# Patient Record
Sex: Male | Born: 1965 | Race: White | Hispanic: No | Marital: Married | Smoking: Current some day smoker
Health system: Southern US, Community
[De-identification: ages and names within clinical notes are randomized; demographics above are authoritative.]

## PROBLEM LIST (undated history)

## (undated) DIAGNOSIS — F329 Major depressive disorder, single episode, unspecified: Secondary | ICD-10-CM

## (undated) DIAGNOSIS — T8859XA Other complications of anesthesia, initial encounter: Secondary | ICD-10-CM

## (undated) DIAGNOSIS — K219 Gastro-esophageal reflux disease without esophagitis: Secondary | ICD-10-CM

## (undated) DIAGNOSIS — F32A Depression, unspecified: Secondary | ICD-10-CM

## (undated) DIAGNOSIS — J45909 Unspecified asthma, uncomplicated: Secondary | ICD-10-CM

## (undated) DIAGNOSIS — Z973 Presence of spectacles and contact lenses: Secondary | ICD-10-CM

## (undated) DIAGNOSIS — T4145XA Adverse effect of unspecified anesthetic, initial encounter: Secondary | ICD-10-CM

## (undated) DIAGNOSIS — I10 Essential (primary) hypertension: Secondary | ICD-10-CM

## (undated) DIAGNOSIS — M1990 Unspecified osteoarthritis, unspecified site: Secondary | ICD-10-CM

## (undated) DIAGNOSIS — J189 Pneumonia, unspecified organism: Secondary | ICD-10-CM

## (undated) DIAGNOSIS — G4730 Sleep apnea, unspecified: Secondary | ICD-10-CM

## (undated) DIAGNOSIS — R51 Headache: Secondary | ICD-10-CM

## (undated) HISTORY — PX: MUSCLE BIOPSY: SHX716

---

## 2009-02-11 HISTORY — PX: JOINT REPLACEMENT: SHX530

## 2009-02-20 ENCOUNTER — Inpatient Hospital Stay (HOSPITAL_COMMUNITY): Admission: RE | Admit: 2009-02-20 | Discharge: 2009-02-23 | Payer: Self-pay | Admitting: Orthopedic Surgery

## 2010-04-29 LAB — PROTIME-INR
INR: 3.43 — ABNORMAL HIGH (ref 0.00–1.49)
Prothrombin Time: 18.6 seconds — ABNORMAL HIGH (ref 11.6–15.2)
Prothrombin Time: 34.3 seconds — ABNORMAL HIGH (ref 11.6–15.2)

## 2010-04-29 LAB — BASIC METABOLIC PANEL
BUN: 11 mg/dL (ref 6–23)
CO2: 27 mEq/L (ref 19–32)
Calcium: 9.7 mg/dL (ref 8.4–10.5)
GFR calc Af Amer: >60 mL/min (ref >60)
GFR calc non Af Amer: >60 mL/min (ref >60)
GFR calc non Af Amer: >60 mL/min (ref >60)
Glucose, Bld: 167 mg/dL — ABNORMAL HIGH (ref 70–99)
Potassium: 3.8 mEq/L (ref 3.5–5.1)
Potassium: 4.4 mEq/L (ref 3.5–5.1)
Sodium: 136 mEq/L (ref 135–145)

## 2010-04-29 LAB — DIFFERENTIAL
Lymphocytes Relative: 31 % (ref 12–46)
Monocytes Absolute: 0.6 10*3/uL (ref 0.1–1.0)
Monocytes Relative: 6 % (ref 3–12)
Neutro Abs: 5.6 10*3/uL (ref 1.7–7.7)

## 2010-04-29 LAB — CBC
HCT: 30.8 % — ABNORMAL LOW (ref 39.0–52.0)
HCT: 42 % (ref 39.0–52.0)
Hemoglobin: 14.7 g/dL (ref 13.0–17.0)
MCHC: 34.4 g/dL (ref 30.0–36.0)
MCHC: 34.7 g/dL (ref 30.0–36.0)
MCV: 89.5 fL (ref 78.0–100.0)
Platelets: 213 10*3/uL (ref 150–400)
Platelets: 218 10*3/uL (ref 150–400)
RBC: 3.02 MIL/uL — ABNORMAL LOW (ref 4.22–5.81)
RBC: 4.73 MIL/uL (ref 4.22–5.81)
RDW: 13.5 % (ref 11.5–15.5)
RDW: 13.9 % (ref 11.5–15.5)
WBC: 18.5 10*3/uL — ABNORMAL HIGH (ref 4.0–10.5)

## 2010-04-29 LAB — TYPE AND SCREEN
ABO/RH(D): A POS
Antibody Screen: NEGATIVE

## 2010-04-29 LAB — URINALYSIS, ROUTINE W REFLEX MICROSCOPIC
Ketones, ur: NEGATIVE mg/dL
Nitrite: NEGATIVE
Specific Gravity, Urine: 1.019 (ref 1.005–1.030)
pH: 6.5 (ref 5.0–8.0)

## 2010-04-29 LAB — APTT: aPTT: 31 seconds (ref 24–37)

## 2010-12-25 ENCOUNTER — Other Ambulatory Visit (HOSPITAL_COMMUNITY): Payer: Self-pay | Admitting: Orthopedic Surgery

## 2010-12-25 DIAGNOSIS — M25551 Pain in right hip: Secondary | ICD-10-CM

## 2010-12-28 ENCOUNTER — Ambulatory Visit (HOSPITAL_COMMUNITY)
Admission: RE | Admit: 2010-12-28 | Discharge: 2010-12-28 | Disposition: A | Discharge status: Home / Self Care | Payer: BC Managed Care – PPO | Source: Ambulatory Visit | Attending: Orthopedic Surgery | Admitting: Orthopedic Surgery

## 2010-12-28 DIAGNOSIS — M25551 Pain in right hip: Secondary | ICD-10-CM

## 2010-12-28 DIAGNOSIS — M25559 Pain in unspecified hip: Secondary | ICD-10-CM | POA: Insufficient documentation

## 2011-10-25 ENCOUNTER — Other Ambulatory Visit (HOSPITAL_COMMUNITY): Payer: Self-pay | Admitting: Orthopedic Surgery

## 2011-10-25 DIAGNOSIS — M25559 Pain in unspecified hip: Secondary | ICD-10-CM

## 2011-10-25 DIAGNOSIS — M1610 Unilateral primary osteoarthritis, unspecified hip: Secondary | ICD-10-CM

## 2011-10-29 ENCOUNTER — Ambulatory Visit (HOSPITAL_COMMUNITY)
Admission: RE | Admit: 2011-10-29 | Discharge: 2011-10-29 | Disposition: A | Discharge status: Home / Self Care | Payer: BC Managed Care – PPO | Source: Ambulatory Visit | Attending: Orthopedic Surgery | Admitting: Orthopedic Surgery

## 2011-10-29 DIAGNOSIS — M25559 Pain in unspecified hip: Secondary | ICD-10-CM | POA: Insufficient documentation

## 2011-10-29 DIAGNOSIS — M1610 Unilateral primary osteoarthritis, unspecified hip: Secondary | ICD-10-CM

## 2011-10-29 DIAGNOSIS — Z96649 Presence of unspecified artificial hip joint: Secondary | ICD-10-CM | POA: Insufficient documentation

## 2011-12-20 ENCOUNTER — Other Ambulatory Visit: Payer: Self-pay | Admitting: Orthopedic Surgery

## 2011-12-20 ENCOUNTER — Encounter (HOSPITAL_COMMUNITY): Payer: Self-pay | Admitting: Pharmacy Technician

## 2011-12-26 NOTE — Pre-Procedure Instructions (Addendum)
20 Eduardo Tucker  12/26/2011   Your procedure is scheduled on:  Wednesday November 20  Report to [facility] at 10:00 AM.  Call this number if you have problems the morning of surgery: [phone]   Remember:   Do not eat or drink:After Midnight.    Take these medicines the morning of surgery with A SIP OF WATER: Carbamazepine, Depakote, Lithium, Lyrica pain med  STOP aspirin, multi vit now   Do not wear jewelry, make-up or nail polish.  Do not wear lotions, powders, or perfumes. You may wear deodorant.  Do not shave 48 hours prior to surgery. Men may shave face and neck.  Do not bring valuables to the hospital.  Contacts, dentures or bridgework may not be worn into surgery.  Leave suitcase in the car. After surgery it may be brought to your room.  For patients admitted to the hospital, checkout time is 11:00 AM the day of discharge.   Patients discharged the day of surgery will not be allowed to drive home.  Judeth Cornfield Lauter wife [phone]  Special Instructions: Shower using CHG 2 nights before surgery and the night before surgery.  If you shower the day of surgery use CHG.  Use special wash - you have one bottle of CHG for all showers.  You should use approximately 1/3 of the bottle for each shower.   Please read over the following fact sheets that you were given: Pain Booklet, Coughing and Deep Breathing, Blood Transfusion Information and Surgical Site Infection Prevention

## 2011-12-27 ENCOUNTER — Encounter (HOSPITAL_COMMUNITY): Payer: Self-pay

## 2011-12-27 ENCOUNTER — Encounter (HOSPITAL_COMMUNITY): Admission: RE | Admit: 2011-12-27 | Payer: BC Managed Care – PPO | Source: Ambulatory Visit

## 2011-12-27 ENCOUNTER — Encounter (HOSPITAL_COMMUNITY)
Admission: RE | Admit: 2011-12-27 | Discharge: 2011-12-27 | Disposition: A | Discharge status: Home / Self Care | Payer: BC Managed Care – PPO | Source: Ambulatory Visit | Attending: Orthopedic Surgery | Admitting: Orthopedic Surgery

## 2011-12-27 HISTORY — DX: Gastro-esophageal reflux disease without esophagitis: K21.9

## 2011-12-27 HISTORY — DX: Unspecified osteoarthritis, unspecified site: M19.90

## 2011-12-27 HISTORY — DX: Depression, unspecified: F32.A

## 2011-12-27 HISTORY — DX: Pneumonia, unspecified organism: J18.9

## 2011-12-27 HISTORY — DX: Major depressive disorder, single episode, unspecified: F32.9

## 2011-12-27 HISTORY — DX: Sleep apnea, unspecified: G47.30

## 2011-12-27 HISTORY — DX: Headache: R51

## 2011-12-27 HISTORY — DX: Essential (primary) hypertension: I10

## 2011-12-27 HISTORY — DX: Unspecified asthma, uncomplicated: J45.909

## 2011-12-27 LAB — COMPREHENSIVE METABOLIC PANEL
ALT: 29 U/L (ref 0–53)
AST: 16 U/L (ref 0–37)
Albumin: 3.8 g/dL (ref 3.5–5.2)
Alkaline Phosphatase: 67 U/L (ref 39–117)
Calcium: 9.8 mg/dL (ref 8.4–10.5)
GFR calc Af Amer: >90 mL/min (ref >90)
Potassium: 3.7 mEq/L (ref 3.5–5.1)
Sodium: 140 mEq/L (ref 135–145)
Total Protein: 7.3 g/dL (ref 6.0–8.3)

## 2011-12-27 LAB — TYPE AND SCREEN
ABO/RH(D): A POS
Antibody Screen: NEGATIVE

## 2011-12-27 LAB — URINALYSIS, ROUTINE W REFLEX MICROSCOPIC
Bilirubin Urine: NEGATIVE
Hgb urine dipstick: NEGATIVE
Nitrite: NEGATIVE
Protein, ur: NEGATIVE mg/dL
Specific Gravity, Urine: 1.025 (ref 1.005–1.030)
Urobilinogen, UA: 0.2 mg/dL (ref 0.0–1.0)

## 2011-12-27 LAB — CBC WITH DIFFERENTIAL/PLATELET
Basophils Absolute: 0.1 10*3/uL (ref 0.0–0.1)
Basophils Relative: 1 % (ref 0–1)
Eosinophils Absolute: 0.2 10*3/uL (ref 0.0–0.7)
Eosinophils Relative: 2 % (ref 0–5)
Lymphs Abs: 2.2 10*3/uL (ref 0.7–4.0)
MCH: 30.8 pg (ref 26.0–34.0)
MCHC: 35.6 g/dL (ref 30.0–36.0)
MCV: 86.7 fL (ref 78.0–100.0)
Neutrophils Relative %: 62 % (ref 43–77)
Platelets: 268 10*3/uL (ref 150–400)
RBC: 4.8 MIL/uL (ref 4.22–5.81)
RDW: 13.2 % (ref 11.5–15.5)

## 2011-12-27 NOTE — Progress Notes (Signed)
Sleep study req from [facility]

## 2011-12-27 NOTE — Progress Notes (Signed)
cxr not done at preadmit needs dos

## 2012-01-01 ENCOUNTER — Ambulatory Visit (HOSPITAL_COMMUNITY): Payer: BC Managed Care – PPO

## 2012-01-01 ENCOUNTER — Ambulatory Visit (HOSPITAL_COMMUNITY): Payer: BC Managed Care – PPO | Admitting: Anesthesiology

## 2012-01-01 ENCOUNTER — Encounter (HOSPITAL_COMMUNITY): Payer: Self-pay | Admitting: Anesthesiology

## 2012-01-01 ENCOUNTER — Encounter (HOSPITAL_COMMUNITY)
Admission: RE | Disposition: A | Discharge status: Home / Self Care | Payer: Self-pay | Source: Ambulatory Visit | Attending: Orthopedic Surgery

## 2012-01-01 ENCOUNTER — Encounter (HOSPITAL_COMMUNITY): Payer: Self-pay | Admitting: *Deleted

## 2012-01-01 ENCOUNTER — Ambulatory Visit (HOSPITAL_COMMUNITY)
Admission: RE | Admit: 2012-01-01 | Discharge: 2012-01-01 | Disposition: A | Discharge status: Home / Self Care | Payer: BC Managed Care – PPO | Source: Ambulatory Visit | Attending: Orthopedic Surgery | Admitting: Orthopedic Surgery

## 2012-01-01 DIAGNOSIS — J45909 Unspecified asthma, uncomplicated: Secondary | ICD-10-CM | POA: Insufficient documentation

## 2012-01-01 DIAGNOSIS — Z01812 Encounter for preprocedural laboratory examination: Secondary | ICD-10-CM | POA: Insufficient documentation

## 2012-01-01 DIAGNOSIS — M5126 Other intervertebral disc displacement, lumbar region: Secondary | ICD-10-CM | POA: Insufficient documentation

## 2012-01-01 DIAGNOSIS — Z01810 Encounter for preprocedural cardiovascular examination: Secondary | ICD-10-CM | POA: Insufficient documentation

## 2012-01-01 DIAGNOSIS — I10 Essential (primary) hypertension: Secondary | ICD-10-CM | POA: Insufficient documentation

## 2012-01-01 HISTORY — PX: LUMBAR LAMINECTOMY/DECOMPRESSION MICRODISCECTOMY: SHX5026

## 2012-01-01 SURGERY — LUMBAR LAMINECTOMY/DECOMPRESSION MICRODISCECTOMY
Anesthesia: General | Site: Spine Lumbar | Laterality: Right | Wound class: Clean

## 2012-01-01 MED ORDER — CEFAZOLIN SODIUM 1-5 GM-% IV SOLN: 1.0000 g | Freq: Three times a day (TID) | INTRAVENOUS | Status: DC

## 2012-01-01 MED ORDER — OXYCODONE-ACETAMINOPHEN 5-325 MG PO TABS: 1.0000 | ORAL_TABLET | ORAL | Status: DC | PRN

## 2012-01-01 MED ORDER — MIDAZOLAM HCL 5 MG/5ML IJ SOLN
INTRAMUSCULAR | Status: DC | PRN
  Administered 2012-01-01: 2 mg via INTRAVENOUS

## 2012-01-01 MED ORDER — HYDROMORPHONE HCL PF 1 MG/ML IJ SOLN
0.2500 mg | INTRAMUSCULAR | Status: DC | PRN
  Administered 2012-01-01 (×2): 0.5 mg via INTRAVENOUS

## 2012-01-01 MED ORDER — LACTATED RINGERS IV SOLN
INTRAVENOUS | Status: DC
  Administered 2012-01-01: 11:00:00 via INTRAVENOUS

## 2012-01-01 MED ORDER — FENTANYL CITRATE 0.05 MG/ML IJ SOLN
INTRAMUSCULAR | Status: DC | PRN
  Administered 2012-01-01: 50 ug via INTRAVENOUS
  Administered 2012-01-01: 150 ug via INTRAVENOUS

## 2012-01-01 MED ORDER — SODIUM CHLORIDE 0.9 % IV SOLN: 250.0000 mL | INTRAVENOUS | Status: DC

## 2012-01-01 MED ORDER — THROMBIN 20000 UNITS EX SOLR
CUTANEOUS | Status: AC
  Filled 2012-01-01: qty 20000

## 2012-01-01 MED ORDER — DOCUSATE SODIUM 100 MG PO CAPS: 100.0000 mg | ORAL_CAPSULE | Freq: Two times a day (BID) | ORAL | Status: DC

## 2012-01-01 MED ORDER — VECURONIUM BROMIDE 10 MG IV SOLR
INTRAVENOUS | Status: DC | PRN
  Administered 2012-01-01: 8 mg via INTRAVENOUS
  Administered 2012-01-01: 2 mg via INTRAVENOUS

## 2012-01-01 MED ORDER — SENNA 8.6 MG PO TABS: 1.0000 | ORAL_TABLET | Freq: Two times a day (BID) | ORAL | Status: DC

## 2012-01-01 MED ORDER — THROMBIN 20000 UNITS EX SOLR
CUTANEOUS | Status: DC | PRN
  Administered 2012-01-01: 13:00:00 via TOPICAL

## 2012-01-01 MED ORDER — METHYLPREDNISOLONE ACETATE 40 MG/ML IJ SUSP
INTRAMUSCULAR | Status: AC
  Filled 2012-01-01: qty 1

## 2012-01-01 MED ORDER — ALUM & MAG HYDROXIDE-SIMETH 200-200-20 MG/5ML PO SUSP: 30.0000 mL | Freq: Four times a day (QID) | ORAL | Status: DC | PRN

## 2012-01-01 MED ORDER — BUPIVACAINE-EPINEPHRINE 0.25% -1:200000 IJ SOLN
INTRAMUSCULAR | Status: DC | PRN
  Administered 2012-01-01: 22 mL

## 2012-01-01 MED ORDER — LACTATED RINGERS IV SOLN
INTRAVENOUS | Status: DC | PRN
  Administered 2012-01-01 (×2): via INTRAVENOUS

## 2012-01-01 MED ORDER — INDIGOTINDISULFONATE SODIUM 8 MG/ML IJ SOLN
INTRAMUSCULAR | Status: AC
  Filled 2012-01-01: qty 5

## 2012-01-01 MED ORDER — INDIGOTINDISULFONATE SODIUM 8 MG/ML IJ SOLN
INTRAMUSCULAR | Status: DC | PRN
  Administered 2012-01-01: .1 mL via INTRAVENOUS

## 2012-01-01 MED ORDER — POVIDONE-IODINE 7.5 % EX SOLN
Freq: Once | CUTANEOUS | Status: DC
  Filled 2012-01-01: qty 118

## 2012-01-01 MED ORDER — PROPOFOL 10 MG/ML IV BOLUS
INTRAVENOUS | Status: DC | PRN
  Administered 2012-01-01: 300 mg via INTRAVENOUS

## 2012-01-01 MED ORDER — BUPIVACAINE-EPINEPHRINE PF 0.25-1:200000 % IJ SOLN
INTRAMUSCULAR | Status: AC
  Filled 2012-01-01: qty 30

## 2012-01-01 MED ORDER — LIDOCAINE HCL (CARDIAC) 20 MG/ML IV SOLN
INTRAVENOUS | Status: DC | PRN
  Administered 2012-01-01: 100 mg via INTRAVENOUS

## 2012-01-01 MED ORDER — ACETAMINOPHEN 325 MG PO TABS: 650.0000 mg | ORAL_TABLET | ORAL | Status: DC | PRN

## 2012-01-01 MED ORDER — MORPHINE SULFATE 2 MG/ML IJ SOLN: 2.0000 mg | INTRAMUSCULAR | Status: DC | PRN

## 2012-01-01 MED ORDER — ACETAMINOPHEN 650 MG RE SUPP: 650.0000 mg | RECTAL | Status: DC | PRN

## 2012-01-01 MED ORDER — LIDOCAINE HCL 4 % MT SOLN
OROMUCOSAL | Status: DC | PRN
  Administered 2012-01-01: 4 mL via TOPICAL

## 2012-01-01 MED ORDER — VANCOMYCIN HCL 1000 MG IV SOLR
1500.0000 mg | INTRAVENOUS | Status: AC
  Administered 2012-01-01: 1500 mg via INTRAVENOUS
  Filled 2012-01-01: qty 1500

## 2012-01-01 MED ORDER — SODIUM CHLORIDE 0.9 % IJ SOLN: 3.0000 mL | Freq: Two times a day (BID) | INTRAMUSCULAR | Status: DC

## 2012-01-01 MED ORDER — ONDANSETRON HCL 4 MG/2ML IJ SOLN
INTRAMUSCULAR | Status: AC
  Filled 2012-01-01: qty 2

## 2012-01-01 MED ORDER — POTASSIUM CHLORIDE IN NACL 20-0.9 MEQ/L-% IV SOLN: INTRAVENOUS | Status: DC

## 2012-01-01 MED ORDER — ZOLPIDEM TARTRATE 5 MG PO TABS: 5.0000 mg | ORAL_TABLET | Freq: Every evening | ORAL | Status: DC | PRN

## 2012-01-01 MED ORDER — ARTIFICIAL TEARS OP OINT
TOPICAL_OINTMENT | OPHTHALMIC | Status: DC | PRN
  Administered 2012-01-01: 1 via OPHTHALMIC

## 2012-01-01 MED ORDER — PHENOL 1.4 % MT LIQD: 1.0000 | OROMUCOSAL | Status: DC | PRN

## 2012-01-01 MED ORDER — NEOSTIGMINE METHYLSULFATE 1 MG/ML IJ SOLN
INTRAMUSCULAR | Status: DC | PRN
  Administered 2012-01-01: 4 mg via INTRAVENOUS

## 2012-01-01 MED ORDER — DIAZEPAM 5 MG PO TABS: 5.0000 mg | ORAL_TABLET | Freq: Four times a day (QID) | ORAL | Status: DC | PRN

## 2012-01-01 MED ORDER — ONDANSETRON HCL 4 MG/2ML IJ SOLN
INTRAMUSCULAR | Status: DC | PRN
  Administered 2012-01-01: 4 mg via INTRAVENOUS

## 2012-01-01 MED ORDER — ONDANSETRON HCL 4 MG/2ML IJ SOLN: 4.0000 mg | Freq: Once | INTRAMUSCULAR | Status: DC | PRN

## 2012-01-01 MED ORDER — SODIUM CHLORIDE 0.9 % IJ SOLN: 3.0000 mL | INTRAMUSCULAR | Status: DC | PRN

## 2012-01-01 MED ORDER — HYDROMORPHONE HCL PF 1 MG/ML IJ SOLN
INTRAMUSCULAR | Status: AC
  Filled 2012-01-01: qty 1

## 2012-01-01 MED ORDER — METHYLPREDNISOLONE ACETATE 40 MG/ML IJ SUSP
INTRAMUSCULAR | Status: DC | PRN
  Administered 2012-01-01: 1 mL

## 2012-01-01 MED ORDER — HEMOSTATIC AGENTS (NO CHARGE) OPTIME
TOPICAL | Status: DC | PRN
  Administered 2012-01-01: 1 via TOPICAL

## 2012-01-01 MED ORDER — ONDANSETRON HCL 4 MG/2ML IJ SOLN
4.0000 mg | INTRAMUSCULAR | Status: DC | PRN
  Administered 2012-01-01: 4 mg via INTRAVENOUS

## 2012-01-01 MED ORDER — GLYCOPYRROLATE 0.2 MG/ML IJ SOLN
INTRAMUSCULAR | Status: DC | PRN
  Administered 2012-01-01: .6 mg via INTRAVENOUS

## 2012-01-01 MED ORDER — PHENYLEPHRINE HCL 10 MG/ML IJ SOLN
INTRAMUSCULAR | Status: DC | PRN
  Administered 2012-01-01: 80 ug via INTRAVENOUS

## 2012-01-01 MED ORDER — MENTHOL 3 MG MT LOZG: 1.0000 | LOZENGE | OROMUCOSAL | Status: DC | PRN

## 2012-01-01 SURGICAL SUPPLY — 60 items
BENZOIN TINCTURE PRP APPL 2/3 (GAUZE/BANDAGES/DRESSINGS) ×4 IMPLANT
BUR ROUND PRECISION 4.0 (BURR) ×2 IMPLANT
CANISTER SUCTION 2500CC (MISCELLANEOUS) ×2 IMPLANT
CLOTH BEACON ORANGE TIMEOUT ST (SAFETY) ×2 IMPLANT
CLSR STERI-STRIP ANTIMIC 1/2X4 (GAUZE/BANDAGES/DRESSINGS) ×2 IMPLANT
CORDS BIPOLAR (ELECTRODE) ×2 IMPLANT
COVER SURGICAL LIGHT HANDLE (MISCELLANEOUS) ×2 IMPLANT
DECANTER SPIKE VIAL GLASS SM (MISCELLANEOUS) ×2 IMPLANT
DRAIN CHANNEL 15F RND FF W/TCR (WOUND CARE) IMPLANT
DRAPE POUCH INSTRU U-SHP 10X18 (DRAPES) ×4 IMPLANT
DRAPE SURG 17X23 STRL (DRAPES) ×8 IMPLANT
DURAPREP 26ML APPLICATOR (WOUND CARE) ×2 IMPLANT
ELECT BLADE 4.0 EZ CLEAN MEGAD (MISCELLANEOUS) ×2
ELECT CAUTERY BLADE 6.4 (BLADE) ×2 IMPLANT
ELECT REM PT RETURN 9FT ADLT (ELECTROSURGICAL) ×2
ELECTRODE BLDE 4.0 EZ CLN MEGD (MISCELLANEOUS) ×1 IMPLANT
ELECTRODE REM PT RTRN 9FT ADLT (ELECTROSURGICAL) ×1 IMPLANT
EVACUATOR SILICONE 100CC (DRAIN) IMPLANT
FILTER STRAW FLUID ASPIR (MISCELLANEOUS) ×2 IMPLANT
GAUZE SPONGE 4X4 16PLY XRAY LF (GAUZE/BANDAGES/DRESSINGS) ×4 IMPLANT
GLOVE BIO SURGEON STRL SZ8 (GLOVE) ×2 IMPLANT
GLOVE BIOGEL PI IND STRL 8 (GLOVE) ×1 IMPLANT
GLOVE BIOGEL PI INDICATOR 8 (GLOVE) ×1
GOWN PREVENTION PLUS LG XLONG (DISPOSABLE) ×4 IMPLANT
GOWN STRL NON-REIN LRG LVL3 (GOWN DISPOSABLE) ×4 IMPLANT
IV CATH 14GX2 1/4 (CATHETERS) ×2 IMPLANT
KIT BASIN OR (CUSTOM PROCEDURE TRAY) ×2 IMPLANT
KIT ROOM TURNOVER OR (KITS) ×2 IMPLANT
NEEDLE 18GX1X1/2 (RX/OR ONLY) (NEEDLE) ×2 IMPLANT
NEEDLE 22X1 1/2 (OR ONLY) (NEEDLE) IMPLANT
NEEDLE HYPO 25GX1X1/2 BEV (NEEDLE) ×2 IMPLANT
NEEDLE SPNL 18GX3.5 QUINCKE PK (NEEDLE) ×4 IMPLANT
NS IRRIG 1000ML POUR BTL (IV SOLUTION) ×2 IMPLANT
PACK LAMINECTOMY ORTHO (CUSTOM PROCEDURE TRAY) ×2 IMPLANT
PACK UNIVERSAL I (CUSTOM PROCEDURE TRAY) ×2 IMPLANT
PAD ARMBOARD 7.5X6 YLW CONV (MISCELLANEOUS) ×4 IMPLANT
PATTIES SURGICAL .5 X.5 (GAUZE/BANDAGES/DRESSINGS) ×2 IMPLANT
PATTIES SURGICAL .5 X1 (DISPOSABLE) ×2 IMPLANT
SPONGE GAUZE 4X4 12PLY (GAUZE/BANDAGES/DRESSINGS) ×2 IMPLANT
SPONGE SURGIFOAM ABS GEL 100 (HEMOSTASIS) ×2 IMPLANT
STRIP CLOSURE SKIN 1/2X4 (GAUZE/BANDAGES/DRESSINGS) ×2 IMPLANT
SURGIFLO TRUKIT (HEMOSTASIS) ×2 IMPLANT
SUT MNCRL AB 4-0 PS2 18 (SUTURE) ×2 IMPLANT
SUT VIC AB 0 CT1 18XCR BRD 8 (SUTURE) ×1 IMPLANT
SUT VIC AB 0 CT1 27 (SUTURE)
SUT VIC AB 0 CT1 27XBRD ANBCTR (SUTURE) IMPLANT
SUT VIC AB 0 CT1 8-18 (SUTURE) ×1
SUT VIC AB 1 CT1 18XCR BRD 8 (SUTURE) ×1 IMPLANT
SUT VIC AB 1 CT1 8-18 (SUTURE) ×1
SUT VIC AB 2-0 CT2 18 VCP726D (SUTURE) ×2 IMPLANT
SYR 20CC LL (SYRINGE) IMPLANT
SYR BULB IRRIGATION 50ML (SYRINGE) ×2 IMPLANT
SYR CONTROL 10ML LL (SYRINGE) ×4 IMPLANT
SYR TB 1ML 26GX3/8 SAFETY (SYRINGE) ×4 IMPLANT
SYR TB 1ML LUER SLIP (SYRINGE) ×4 IMPLANT
TAPE CLOTH SURG 4X10 WHT LF (GAUZE/BANDAGES/DRESSINGS) ×2 IMPLANT
TOWEL OR 17X24 6PK STRL BLUE (TOWEL DISPOSABLE) ×2 IMPLANT
TOWEL OR 17X26 10 PK STRL BLUE (TOWEL DISPOSABLE) ×2 IMPLANT
WATER STERILE IRR 1000ML POUR (IV SOLUTION) ×2 IMPLANT
YANKAUER SUCT BULB TIP NO VENT (SUCTIONS) ×2 IMPLANT

## 2012-01-01 NOTE — H&P (Signed)
PREOPERATIVE H&P  Chief Complaint: right leg pain  HPI: Eduardo Tucker is a 46 y.o. male who presents with right leg pain  Past Medical History  Diagnosis Date  . Hypertension   . Depression   . Asthma   . Pneumonia     hx  . Sleep apnea     cpap used 6 yrs  . GERD (gastroesophageal reflux disease)     hx  . Headache     hx migraines  . Arthritis    Past Surgical History  Procedure Date  . Joint replacement 11    hip rt  . Muscle biopsy     rt tricep mass   History   Social History  . Marital Status: Married    Spouse Name: N/A    Number of Children: N/A  . Years of Education: N/A   Social History Main Topics  . Smoking status: Current Some Day Smoker -- 2 years    Types: Cigars  . Smokeless tobacco: None     Comment: approx 1 month  social rinker occ  . Alcohol Use: Yes  . Drug Use: No  . Sexually Active:    Other Topics Concern  . None   Social History Narrative  . None   History reviewed. No pertinent family history. Allergies  Allergen Reactions  . Penicillins     unknown     Prior to Admission medications   Medication Sig Start Date End Date Taking? Authorizing Provider  ALPRAZolam Prudy Feeler) 1 MG tablet Take 1 mg by mouth at bedtime as needed.   Yes Historical Provider, MD  aspirin 81 MG tablet Take 81 mg by mouth daily.   Yes Historical Provider, MD  atorvastatin (LIPITOR) 80 MG tablet Take 80 mg by mouth daily.   Yes Historical Provider, MD  carbamazepine (ANTIPSYCHOTIC - EQUETRO) 200 MG CP12 Take 200 mg by mouth 2 times daily at 12 noon and 4 pm. Take four times a day per patient.   Yes Historical Provider, MD  divalproex (DEPAKOTE) 500 MG DR tablet Take 2,000 mg by mouth Nightly.    Yes Historical Provider, MD  HYDROcodone-acetaminophen (NORCO/VICODIN) 5-325 MG per tablet Take 1 tablet by mouth every 6 (six) hours as needed.   Yes Historical Provider, MD  lithium carbonate (ESKALITH) 450 MG CR tablet Take 700 mg by mouth Nightly.    Yes  Historical Provider, MD  losartan-hydrochlorothiazide (HYZAAR) 100-25 MG per tablet Take 1 tablet by mouth daily.   Yes Historical Provider, MD  methocarbamol (ROBAXIN) 500 MG tablet Take 500 mg by mouth every 4 (four) hours as needed.    Yes Historical Provider, MD  Multiple Vitamin (MULTIVITAMIN WITH MINERALS) TABS Take 1 tablet by mouth daily.   Yes Historical Provider, MD  pregabalin (LYRICA) 50 MG capsule Take 50 mg by mouth 2 (two) times daily.   Yes Historical Provider, MD  diclofenac (VOLTAREN) 75 MG EC tablet Take 75 mg by mouth once.    Historical Provider, MD  fluticasone-salmeterol (ADVAIR HFA) 115-21 MCG/ACT inhaler Inhale 2 puffs into the lungs daily as needed.    Historical Provider, MD     All other systems have been reviewed and were otherwise negative with the exception of those mentioned in the HPI and as above.  Physical Exam: There were no vitals filed for this visit.  General: Alert, no acute distress Cardiovascular: No pedal edema Respiratory: No cyanosis, no use of accessory musculature GI: No organomegaly, abdomen is soft and non-tender  Skin: No lesions in the area of chief complaint Neurologic: Sensation intact distally Psychiatric: Patient is competent for consent with normal mood and affect Lymphatic: No axillary or cervical lymphadenopathy  MUSCULOSKELETAL: + SLR on right  Assessment/Plan: Right sided leg pain Plan for Procedure(s): LUMBAR LAMINECTOMY/DECOMPRESSION   Emilee Hero, MD 01/01/2012 11:23 AM

## 2012-01-01 NOTE — Preoperative (Signed)
Beta Blockers   Reason not to administer Beta Blockers:Not Applicable 

## 2012-01-01 NOTE — Transfer of Care (Signed)
Immediate Anesthesia Transfer of Care Note  Patient: Eduardo Tucker  Procedure(s) Performed: Procedure(s) (LRB) with comments: LUMBAR LAMINECTOMY/DECOMPRESSION MICRODISCECTOMY (Right) - Right sided lumbar 5-sacrum 1 microdisectomy  Patient Location: PACU  Anesthesia Type:General  Level of Consciousness: awake, sedated and patient cooperative  Airway & Oxygen Therapy: Patient Spontanous Breathing and Patient connected to face mask oxygen  Post-op Assessment: Report given to PACU RN, Post -op Vital signs reviewed and stable and Patient moving all extremities  Post vital signs: Reviewed and stable  Complications: No apparent anesthesia complications

## 2012-01-01 NOTE — Anesthesia Preprocedure Evaluation (Addendum)
Anesthesia Evaluation  Patient identified by MRN, date of birth, ID band Patient awake    Reviewed: Allergy & Precautions, H&P , NPO status , Patient's Chart, lab work & pertinent test results  Airway Mallampati: I TM Distance: >3 FB Neck ROM: full    Dental  (+) Teeth Intact and Dental Advidsory Given   Pulmonary asthma , sleep apnea , Current Smoker,          Cardiovascular hypertension, Rhythm:regular Rate:Normal     Neuro/Psych  Headaches, PSYCHIATRIC DISORDERS Depression    GI/Hepatic GERD-  ,  Endo/Other    Renal/GU      Musculoskeletal   Abdominal   Peds  Hematology   Anesthesia Other Findings   Reproductive/Obstetrics                          Anesthesia Physical Anesthesia Plan  ASA: II  Anesthesia Plan: General   Post-op Pain Management:    Induction: Intravenous  Airway Management Planned: Oral ETT  Additional Equipment:   Intra-op Plan:   Post-operative Plan: Extubation in OR  Informed Consent: I have reviewed the patients History and Physical, chart, labs and discussed the procedure including the risks, benefits and alternatives for the proposed anesthesia with the patient or authorized representative who has indicated his/her understanding and acceptance.   Dental Advisory Given  Plan Discussed with: CRNA, Anesthesiologist and Surgeon  Anesthesia Plan Comments:        Anesthesia Quick Evaluation

## 2012-01-01 NOTE — Anesthesia Postprocedure Evaluation (Signed)
Anesthesia Post Note  Patient: Eduardo Tucker  Procedure(s) Performed: Procedure(s) (LRB): LUMBAR LAMINECTOMY/DECOMPRESSION MICRODISCECTOMY (Right)  Anesthesia type: general  Patient location: PACU  Post pain: Pain level controlled  Post assessment: Patient's Cardiovascular Status Stable  Last Vitals:  Filed Vitals:   01/01/12 1630  BP: 153/80  Pulse: 103  Temp:   Resp: 25    Post vital signs: Reviewed and stable  Level of consciousness: sedated  Complications: No apparent anesthesia complications

## 2012-01-01 NOTE — Progress Notes (Signed)
Report given to stephanie rn as caregiver 

## 2012-01-01 NOTE — Anesthesia Procedure Notes (Signed)
Procedure Name: Intubation Date/Time: 01/01/2012 12:13 PM Performed by: Carmela Rima Pre-anesthesia Checklist: Patient identified, Timeout performed, Emergency Drugs available, Suction available and Patient being monitored Patient Re-evaluated:Patient Re-evaluated prior to inductionOxygen Delivery Method: Circle system utilized Preoxygenation: Pre-oxygenation with 100% oxygen Intubation Type: IV induction Ventilation: Mask ventilation without difficulty Laryngoscope Size: Mac and 3 Grade View: Grade II Tube size: 7.5 mm Number of attempts: 2 Airway Equipment and Method: Bougie stylet Placement Confirmation: ETT inserted through vocal cords under direct vision,  breath sounds checked- equal and bilateral and positive ETCO2 Secured at: 23 cm Tube secured with: Tape Dental Injury: Injury to lip

## 2012-01-02 ENCOUNTER — Encounter (HOSPITAL_COMMUNITY): Payer: Self-pay | Admitting: Orthopedic Surgery

## 2012-01-02 NOTE — Op Note (Signed)
NAME:  Eduardo Tucker, Eduardo Tucker                       ACCOUNT NO.:  MEDICAL RECORD NO.:  [id number]  LOCATION:                                 FACILITY:  PHYSICIAN:  Estill Bamberg, MD      DATE OF BIRTH:  12/31/1965  DATE OF PROCEDURE:  01/01/2012 DATE OF DISCHARGE:                              OPERATIVE REPORT   PREOPERATIVE DIAGNOSIS: 1. Right-sided L5 radiculopathy. 2. Right-sided L5-S1 neural foraminal stenosis secondary to an L5-S1     disk protrusion and L5-S1 facet hypertrophy.  POSTOPERATIVE DIAGNOSIS: 1. Right-sided L5 radiculopathy. 2. Right-sided L5-S1 neural foraminal stenosis secondary to an L5-S1     disk protrusion and L5-S1 facet hypertrophy.  PROCEDURE:  L5-S1 decompression.  SURGEON:  Estill Bamberg, MD  ASSISTANT:  Leatrice Jewels, PA-C  ANESTHESIA:  General endotracheal anesthesia.  COMPLICATIONS:  None.  DISPOSITION:  Stable.  ESTIMATED BLOOD LOSS:  Minimal.  INDICATIONS FOR PROCEDURE:  Briefly, Mr. Pentico is a pleasant 46 year old male who is a very pleasant 46 year old male who presented to me on December 17, 2011, with severe pain in the right leg.  An MRI did reveal a disk osteophyte complex and facet hypertrophy at L5-S1 which was radiographically causing compression of the exiting L5 nerve.  The patient did have a  right-sided L5 selective nerve block and this did entirely alleviate his symptoms, however,  his improvement was temporary.  He did continue to have ongoing pain.  We therefore did have a discussion regarding going forward with a decompressive procedure at L5-S1.  The patient fully understood the risks and limitations of the procedure as outlined in my preoperative note.  OPERATIVE DETAILS:  On January 01, 2012, the patient was brought to surgery and general endotracheal anesthesia was administered.  The patient was placed prone on a well-padded flat Jackson bed with a spinal frame.  All bony prominences were meticulously padded.  Antibiotics  were given in the form of vancomycin.  A time-out procedure was performed.  I then made a midline incision overlying the L5-S1 interspace.  I then incised the fascia at the midline.  The paraspinal musculature was bluntly swept laterally.  I then obtained a lateral intraoperative radiograph to confirm the appropriate operative level.  I then removed the spinous process of L5.  The ligamentum flavum at the L5-S1 interspace was identified and was taken down.  I then performed a bilateral partial facetectomy on both the right and the left sides. This was needed in order to allow the appropriate angle trajectory to confirm a thorough neuroforaminal decompression on the right side.  Once this was done, I did perform a more aggressive partial facetectomy on the right side.  I was able to identify the traversing S1 nerve and I was able to palpate the S1 pedicle.  The exiting L5 nerve was not readily identified and therefore I did continue deep partial facetectomy on the right side.  I was able to ultimately identify the exiting L5 nerve and palpate the L5 pedicle.  I then attempted to sweep a [facility] out the L5-S1 foramen and I was not able to easily pass the elevator  out the foramen  which did confirm that an additional decompression was needed.  I then continued with an additional partial facetectomy and I did continue to remove the superior articular process of S1.  This was done in a meticulous fashion and I was ultimately able to pass a [facility] for freely out the L5-S1 intervertebral space.  This did confirm appropriate decompression of the exiting L5 nerve.  I then controlled all epidural bleeding  rather meticulously using bipolar electrocautery in addition to Surgiflo.  I then infiltrated 40 mg Depo-Medrol about the epidural space.  Prior to this, the wound was copiously irrigated with approximately 500 mL of normal saline.  The fascia was then closed using #1 Vicryl.   The deep subcutaneous layer was closed using 2-0 Vicryl and the skin was closed using 4-0 Monocryl.  Benzoin and Steri-Strips were applied followed by sterile dressing.  The patient was then awoken from general endotracheal anesthesia and transferred to recovery in stable condition.  Of note, Leatrice Jewels was my assistant throughout the procedure and aided in essential retraction and suctioning required throughout the surgery.     Estill Bamberg, MD     MD/MEDQ  D:  01/01/2012  T:  01/02/2012  Job:  [id number]  cc:   Lonia Farber, MD

## 2012-01-03 ENCOUNTER — Other Ambulatory Visit (HOSPITAL_COMMUNITY): Payer: Self-pay | Admitting: Orthopedic Surgery

## 2012-01-03 ENCOUNTER — Inpatient Hospital Stay
Admission: RE | Admit: 2012-01-03 | Discharge: 2012-01-03 | Disposition: A | Discharge status: Home / Self Care | Payer: Self-pay | Source: Ambulatory Visit | Attending: Orthopedic Surgery | Admitting: Orthopedic Surgery

## 2012-01-03 DIAGNOSIS — R52 Pain, unspecified: Secondary | ICD-10-CM

## 2012-09-29 ENCOUNTER — Encounter (HOSPITAL_BASED_OUTPATIENT_CLINIC_OR_DEPARTMENT_OTHER): Payer: Self-pay | Admitting: *Deleted

## 2012-09-29 NOTE — Progress Notes (Signed)
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

## 2012-10-01 NOTE — H&P (Signed)
Eduardo Tucker is an 47 y.o. male.   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  He reports a long history of carpal tunnel syndrome of at least 6-7 years duration.  The symptoms have become more pronounced recently and the right is worse than the left.  He also reports that the right arm can ache from the dorsum of the hand up the forearm into the shoulder.  He has feelings of some itching and burning between the digits and episodic times when it feels like his hand has been plunged into ice water.  In addition he reports a right long finger has been felt to be catching or triggering for about a month and a half.  His symptoms are much worse at nighttime.  NCS reveal B CTS.  Past Medical History  Diagnosis Date  . Hypertension   . Depression   . Asthma   . Pneumonia     hx  . GERD (gastroesophageal reflux disease)     hx  . Headache(784.0)     hx migraines  . Arthritis   . Wears glasses   . Sleep apnea     cpap used 6 yrs  . Complication of anesthesia     takes so much medicine-he is hard to put under sedation    Past Surgical History  Procedure Laterality Date  . Muscle biopsy      rt tricep mass  . Lumbar laminectomy/decompression microdiscectomy  01/01/2012    Procedure: LUMBAR LAMINECTOMY/DECOMPRESSION MICRODISCECTOMY;  Surgeon: Emilee Hero, MD;  Location: [facility] OR;  Service: Orthopedics;  Laterality: Right;  Right sided lumbar 5-sacrum 1 microdisectomy  . Joint replacement  11    hip rt    History reviewed. No pertinent family history. Social History:  reports that he has been smoking Cigars.  He does not have any smokeless tobacco history on file. He reports that  drinks alcohol. He reports that he does not use illicit drugs.  Allergies:  Allergies  Allergen Reactions  . Penicillins     unknown      No prescriptions  prior to admission    No results found for this or any previous visit (from the past 48 hour(s)). No results found.  Review of Systems  All other systems reviewed and are negative.    Height 5\' 6"  (1.676 m), weight 108.863 kg (240 lb). Physical Exam  Vitals: Refer to EMR. Constitutional:  WD, WN, NAD HEENT:  NCAT, EOMI Neuro/Psych:  Alert & oriented to person, place, and time; appropriate mood & affect Lymphatic: No generalized UE edema or lymphadenopathy Extremities / MSK:  Both UE are normal with respect to appearance, ranges of motion, joint stability, muscle strength/tone, sensation, & perfusion except as otherwise noted:  Both hands have good musculature.  2.8 3 monofilament across all digits on the right.  Negative Tinel's over the median nerve at the wrist or proximal forearm, negative elbow flexion test, negative Phalen's & CCT, negative Spurling's.  There is tenderness slightly proximal to the leading edge of the A1 pulley of the long finger  Labs / Xrays:  No radiographic studies obtained today.  Assessment:  1.  Possible right long finger stenosing tenosynovitis 2.  Possible bilateral carpal tunnel syndrome  Plan: II reviewed the results of the nerve conduction study with him.  He experienced only mild improvement with  the long finger trigger finger injection.  He would like to proceed with a right long finger trigger release and endoscopic carpal tunnel release.  The details of the operative procedure were discussed with the patient.  Questions were invited and answered.  In addition to the goal of the procedure, the risks of the procedure to include but not limited to bleeding; infection; damage to the nerves or blood vessels that could result in bleeding, numbness, weakness, chronic pain, and the need for additional procedures; stiffness; the need for revision surgery; and anesthetic risks, the worst of which is death, were reviewed.  No specific outcome was guaranteed or  implied.  Informed consent was obtained.      Kimela Malstrom A. 10/01/2012, 2:56 PM

## 2012-10-05 ENCOUNTER — Ambulatory Visit (HOSPITAL_BASED_OUTPATIENT_CLINIC_OR_DEPARTMENT_OTHER)
Admission: RE | Admit: 2012-10-05 | Discharge: 2012-10-05 | Disposition: A | Discharge status: Home / Self Care | Payer: BC Managed Care – PPO | Source: Ambulatory Visit | Attending: Orthopedic Surgery | Admitting: Orthopedic Surgery

## 2012-10-05 ENCOUNTER — Encounter (HOSPITAL_BASED_OUTPATIENT_CLINIC_OR_DEPARTMENT_OTHER): Payer: Self-pay | Admitting: Anesthesiology

## 2012-10-05 ENCOUNTER — Encounter (HOSPITAL_BASED_OUTPATIENT_CLINIC_OR_DEPARTMENT_OTHER)
Admission: RE | Disposition: A | Discharge status: Home / Self Care | Payer: Self-pay | Source: Ambulatory Visit | Attending: Orthopedic Surgery

## 2012-10-05 ENCOUNTER — Encounter (HOSPITAL_BASED_OUTPATIENT_CLINIC_OR_DEPARTMENT_OTHER): Payer: Self-pay

## 2012-10-05 ENCOUNTER — Ambulatory Visit (HOSPITAL_BASED_OUTPATIENT_CLINIC_OR_DEPARTMENT_OTHER): Payer: BC Managed Care – PPO | Admitting: Anesthesiology

## 2012-10-05 DIAGNOSIS — M6530 Trigger finger, unspecified finger: Secondary | ICD-10-CM | POA: Insufficient documentation

## 2012-10-05 DIAGNOSIS — K219 Gastro-esophageal reflux disease without esophagitis: Secondary | ICD-10-CM | POA: Insufficient documentation

## 2012-10-05 DIAGNOSIS — G4730 Sleep apnea, unspecified: Secondary | ICD-10-CM | POA: Insufficient documentation

## 2012-10-05 DIAGNOSIS — G5600 Carpal tunnel syndrome, unspecified upper limb: Secondary | ICD-10-CM | POA: Insufficient documentation

## 2012-10-05 DIAGNOSIS — I10 Essential (primary) hypertension: Secondary | ICD-10-CM | POA: Insufficient documentation

## 2012-10-05 HISTORY — DX: Adverse effect of unspecified anesthetic, initial encounter: T41.45XA

## 2012-10-05 HISTORY — DX: Presence of spectacles and contact lenses: Z97.3

## 2012-10-05 HISTORY — PX: TRIGGER FINGER RELEASE: SHX641

## 2012-10-05 HISTORY — PX: CARPAL TUNNEL RELEASE: SHX101

## 2012-10-05 HISTORY — DX: Other complications of anesthesia, initial encounter: T88.59XA

## 2012-10-05 SURGERY — RELEASE, CARPAL TUNNEL, ENDOSCOPIC
Anesthesia: Monitor Anesthesia Care | Site: Wrist | Laterality: Right | Wound class: Clean

## 2012-10-05 MED ORDER — LIDOCAINE HCL 2 % IJ SOLN
INTRAMUSCULAR | Status: DC | PRN
  Administered 2012-10-05: 3.5 mL

## 2012-10-05 MED ORDER — HYDROCODONE-ACETAMINOPHEN 5-325 MG PO TABS: 1.0000 | ORAL_TABLET | ORAL | Status: DC | PRN

## 2012-10-05 MED ORDER — ONDANSETRON HCL 4 MG/2ML IJ SOLN
INTRAMUSCULAR | Status: DC | PRN
  Administered 2012-10-05: 4 mg via INTRAVENOUS

## 2012-10-05 MED ORDER — LACTATED RINGERS IV SOLN
INTRAVENOUS | Status: DC
  Administered 2012-10-05: 09:00:00 via INTRAVENOUS

## 2012-10-05 MED ORDER — LIDOCAINE HCL (CARDIAC) 20 MG/ML IV SOLN
INTRAVENOUS | Status: DC | PRN
  Administered 2012-10-05: 40 mg via INTRAVENOUS

## 2012-10-05 MED ORDER — OXYCODONE-ACETAMINOPHEN 5-325 MG PO TABS: 1.0000 | ORAL_TABLET | ORAL | Status: DC | PRN

## 2012-10-05 MED ORDER — BUPIVACAINE-EPINEPHRINE 0.5% -1:200000 IJ SOLN
INTRAMUSCULAR | Status: DC | PRN
  Administered 2012-10-05: 3.5 mL

## 2012-10-05 MED ORDER — OXYCODONE HCL 5 MG PO TABS: 5.0000 mg | ORAL_TABLET | Freq: Once | ORAL | Status: DC | PRN

## 2012-10-05 MED ORDER — MIDAZOLAM HCL 5 MG/5ML IJ SOLN
INTRAMUSCULAR | Status: DC | PRN
  Administered 2012-10-05: 2 mg via INTRAVENOUS

## 2012-10-05 MED ORDER — OXYCODONE HCL 5 MG/5ML PO SOLN: 5.0000 mg | Freq: Once | ORAL | Status: DC | PRN

## 2012-10-05 MED ORDER — PROPOFOL INFUSION 10 MG/ML OPTIME
INTRAVENOUS | Status: DC | PRN
  Administered 2012-10-05: 100 ug/kg/min via INTRAVENOUS

## 2012-10-05 MED ORDER — HYDROMORPHONE HCL PF 1 MG/ML IJ SOLN: 0.2500 mg | INTRAMUSCULAR | Status: DC | PRN

## 2012-10-05 MED ORDER — FENTANYL CITRATE 0.05 MG/ML IJ SOLN
INTRAMUSCULAR | Status: DC | PRN
  Administered 2012-10-05: 100 ug via INTRAVENOUS

## 2012-10-05 MED ORDER — CLINDAMYCIN PHOSPHATE 600 MG/50ML IV SOLN
INTRAVENOUS | Status: DC | PRN
  Administered 2012-10-05: 600 mg via INTRAVENOUS

## 2012-10-05 MED ORDER — PROPOFOL 10 MG/ML IV BOLUS
INTRAVENOUS | Status: DC | PRN
  Administered 2012-10-05: 100 mg via INTRAVENOUS

## 2012-10-05 MED ORDER — HYDROMORPHONE HCL PF 1 MG/ML IJ SOLN: 0.5000 mg | INTRAMUSCULAR | Status: DC | PRN

## 2012-10-05 SURGICAL SUPPLY — 42 items
APPLICATOR COTTON TIP 6IN STRL (MISCELLANEOUS) ×3 IMPLANT
BANDAGE COBAN STERILE 2 (GAUZE/BANDAGES/DRESSINGS) IMPLANT
BANDAGE CONFORM 2  STR LF (GAUZE/BANDAGES/DRESSINGS) ×3 IMPLANT
BANDAGE GAUZE ELAST BULKY 4 IN (GAUZE/BANDAGES/DRESSINGS) ×6 IMPLANT
BLADE HOOK ENDO STRL (BLADE) ×3 IMPLANT
BLADE MINI RND TIP GREEN BEAV (BLADE) IMPLANT
BLADE SURG 15 STRL LF DISP TIS (BLADE) ×2 IMPLANT
BLADE SURG 15 STRL SS (BLADE) ×1
BLADE TRIANGLE EPF/EGR ENDO (BLADE) ×3 IMPLANT
BNDG COHESIVE 1X5 TAN STRL LF (GAUZE/BANDAGES/DRESSINGS) IMPLANT
BNDG COHESIVE 4X5 TAN STRL (GAUZE/BANDAGES/DRESSINGS) ×3 IMPLANT
BNDG ESMARK 4X9 LF (GAUZE/BANDAGES/DRESSINGS) ×3 IMPLANT
CHLORAPREP W/TINT 26ML (MISCELLANEOUS) ×3 IMPLANT
COVER MAYO STAND STRL (DRAPES) ×3 IMPLANT
COVER TABLE BACK 60X90 (DRAPES) ×3 IMPLANT
CUFF TOURNIQUET SINGLE 18IN (TOURNIQUET CUFF) ×3 IMPLANT
DRAIN TLS ROUND 10FR (DRAIN) ×3 IMPLANT
DRAPE EXTREMITY T 121X128X90 (DRAPE) ×3 IMPLANT
DRAPE SURG 17X23 STRL (DRAPES) ×3 IMPLANT
DRSG EMULSION OIL 3X3 NADH (GAUZE/BANDAGES/DRESSINGS) ×3 IMPLANT
GAUZE SPONGE 4X4 12PLY STRL LF (GAUZE/BANDAGES/DRESSINGS) ×3 IMPLANT
GLOVE BIO SURGEON STRL SZ 6.5 (GLOVE) ×6 IMPLANT
GLOVE BIO SURGEON STRL SZ7.5 (GLOVE) ×3 IMPLANT
GLOVE BIOGEL PI IND STRL 8 (GLOVE) ×2 IMPLANT
GLOVE BIOGEL PI INDICATOR 8 (GLOVE) ×1
GLOVE ECLIPSE 6.5 STRL STRAW (GLOVE) ×3 IMPLANT
GOWN PREVENTION PLUS XLARGE (GOWN DISPOSABLE) ×9 IMPLANT
NDL SAFETY ECLIPSE 18X1.5 (NEEDLE) IMPLANT
NEEDLE HYPO 18GX1.5 SHARP (NEEDLE)
NEEDLE HYPO 25X1 1.5 SAFETY (NEEDLE) IMPLANT
NS IRRIG 1000ML POUR BTL (IV SOLUTION) ×3 IMPLANT
PACK BASIN DAY SURGERY FS (CUSTOM PROCEDURE TRAY) ×3 IMPLANT
PADDING CAST ABS 4INX4YD NS (CAST SUPPLIES)
PADDING CAST ABS COTTON 4X4 ST (CAST SUPPLIES) IMPLANT
SPONGE GAUZE 4X4 12PLY (GAUZE/BANDAGES/DRESSINGS) ×3 IMPLANT
STOCKINETTE 4X48 STRL (DRAPES) ×3 IMPLANT
SUT VICRYL RAPIDE 4/0 PS 2 (SUTURE) ×3 IMPLANT
SYR BULB 3OZ (MISCELLANEOUS) ×3 IMPLANT
SYRINGE 10CC LL (SYRINGE) IMPLANT
TOWEL OR 17X24 6PK STRL BLUE (TOWEL DISPOSABLE) ×6 IMPLANT
TOWEL OR NON WOVEN STRL DISP B (DISPOSABLE) ×3 IMPLANT
UNDERPAD 30X30 INCONTINENT (UNDERPADS AND DIAPERS) ×3 IMPLANT

## 2012-10-05 NOTE — Op Note (Signed)
10/05/2012  7:24 AM  PATIENT:  Eduardo Tucker  47 y.o. male  PRE-OPERATIVE DIAGNOSIS:  R CTS & LF trigger digit  POST-OPERATIVE DIAGNOSIS:  Same  PROCEDURE:  R ECTR via 2-incision tech. & LF trigger release  SURGEON: Cliffton Asters. Janee Morn, MD  PHYSICIAN ASSISTANT: None  ANESTHESIA:  local and MAC  SPECIMENS:  None  DRAINS:   TLS x 1  PREOPERATIVE INDICATIONS:  DRAVYN SEVERS is a  47 y.o. male with a diagnosis of R CTS & LF stenosing tenosynovitis who failed conservative measures and elected for surgical management.    The risks benefits and alternatives were discussed with the patient preoperatively including but not limited to the risks of infection, bleeding, nerve injury, cardiopulmonary complications, the need for revision surgery, among others, and the patient verbalized understanding and consented to proceed.  OPERATIVE IMPLANTS: none  OPERATIVE FINDINGS: Tight canal, fairly loose following release. Thickened tenosynovium of flexor tendons that was debrided  OPERATIVE PROCEDURE:  After receiving prophylactic antibiotics, the patient was escorted to the operative theatre and placed in a supine position.  A surgical "time-out" was performed during which the planned procedure, proposed operative site, and the correct patient identity were compared to the operative consent and agreement confirmed by the circulating nurse according to current facility policy.  The incisions were marked and anesthetized with a mixture of lidocaine and Marcaine bearing epinephrine. Following application of a tourniquet to the operative extremity, the exposed skin was prepped with Chloraprep and draped in the usual sterile fashion.  The limb was exsanguinated with an Esmarch bandage and the tourniquet inflated to approximately higher than systolic BP.  Attention was directed first to the long finger trigger release where an oblique incision was made over the A1 pulley. The neurovascular structures were  retracted radially and ulnarly and the distal flap elevated. The A1 pulley was incised on its midline and and proximal to this mother crossing bands across the tendons were released. The tendons were pulled into view and there was a lot of thickened tenosynovium in the area of inflammation, and this was resected.  Attention was then directed to the endoscopic carpal tunnel release appear the 2 incisions were made sharply with scalpel. Subcutaneous tissues were dissected with blunt and spreading dissection. The deep forearm fascia was split in line with the skin incision proximally and in the palmar incision the palmar fascia was split in line with the skin incision to reveal the underlying superficial palmar arch. The synovial reflector was introduced proximally to reflect synovium from the deep surface the transverse carpal ligament and then the slotted cannula was placed through the proximal wound, across the carpal tunnel exiting the distal wound superficial the superficial palmar arch. The obturator was removed and a Q-tip past. The scope was inserted proximally visualization of the ligament was good. The triangle blade was inserted distally and used to create a perforation in the midportion of the ligament. It was removed. This was followed by placement of the hook blade into the perforation and withdrawing distally completing the distal hemi-transection of the ligament. The instrument was placed into the apex of the V. that had been formed in the distal hemi-transection and it was withdrawn proximally completely transection of the ligament. The endoscopic instruments are removed. The adequacy of the release was judged the proximal perspective using loupe assisted magnification and the distal forearm fascia was split for 2 inches proximal to the proximal incision using a sliding scissor technique under direct vision.  The tourniquet was released and additional hemostasis obtained and the wounds were copiously  irrigated. There was easy egress of the irrigant out the distal wound when introduced proximally. A TLS drain was placed to lie alongside the nerve exiting its own skin hole made distally with its own sharp trocar. The wounds were then closed with 4-0 Vicryl Rapide interrupted sutures, the proximal incision was buried in the others were simple superficial. A bulky dressing was applied and the balance of 10 mL to the anesthetic mixture was placed down the drain to be unclamped in the recovery room.  DISPOSITION: Patient will be discharged home today to return roughly 2 weeks for reevaluation.

## 2012-10-05 NOTE — Anesthesia Postprocedure Evaluation (Signed)
  Anesthesia Post-op Note  Patient: Eduardo Tucker  Procedure(s) Performed: Procedure(s): RIGHT CARPAL TUNNEL RELEASE ENDOSCOPIC (Right) RIGHT LONG FINGER RELEASE TRIGGER FINGER (Right)  Patient Location: PACU  Anesthesia Type:MAC  Level of Consciousness: awake, alert  and oriented  Airway and Oxygen Therapy: Patient Spontanous Breathing  Post-op Pain: mild  Post-op Assessment: Post-op Vital signs reviewed, Patient's Cardiovascular Status Stable, Respiratory Function Stable, Patent Airway and No signs of Nausea or vomiting  Post-op Vital Signs: Reviewed and stable  Complications: No apparent anesthesia complications

## 2012-10-05 NOTE — Anesthesia Preprocedure Evaluation (Signed)
Anesthesia Evaluation  Patient identified by MRN, date of birth, ID band Patient awake    Reviewed: Allergy & Precautions, H&P , NPO status , Patient's Chart, lab work & pertinent test results  Airway Mallampati: II TM Distance: >3 FB Neck ROM: Full    Dental no notable dental hx. (+) Teeth Intact and Dental Advisory Given   Pulmonary asthma , sleep apnea and Continuous Positive Airway Pressure Ventilation ,  breath sounds clear to auscultation  Pulmonary exam normal       Cardiovascular hypertension, On Medications Rhythm:Regular Rate:Normal     Neuro/Psych PSYCHIATRIC DISORDERS negative neurological ROS     GI/Hepatic negative GI ROS, Neg liver ROS, GERD-  ,  Endo/Other  Morbid obesity  Renal/GU negative Renal ROS  negative genitourinary   Musculoskeletal   Abdominal   Peds  Hematology negative hematology ROS (+)   Anesthesia Other Findings   Reproductive/Obstetrics negative OB ROS                           Anesthesia Physical Anesthesia Plan  ASA: III  Anesthesia Plan: MAC   Post-op Pain Management:    Induction: Intravenous  Airway Management Planned: Simple Face Mask  Additional Equipment:   Intra-op Plan:   Post-operative Plan: Extubation in OR  Informed Consent: I have reviewed the patients History and Physical, chart, labs and discussed the procedure including the risks, benefits and alternatives for the proposed anesthesia with the patient or authorized representative who has indicated his/her understanding and acceptance.   Dental advisory given  Plan Discussed with: CRNA  Anesthesia Plan Comments:         Anesthesia Quick Evaluation

## 2012-10-05 NOTE — Interval H&P Note (Signed)
History and Physical Interval Note:  10/05/2012 7:22 AM  Eduardo Tucker  has presented today for surgery, with the diagnosis of Right Carpal Tunnel Syndrome  The various methods of treatment have been discussed with the patient and family. After consideration of risks, benefits and other options for treatment, the patient has consented to  Procedure(s): RIGHT CARPAL TUNNEL RELEASE ENDOSCOPIC (Right) RIGHT LONG FINGER RELEASE TRIGGER FINGER (Right) as a surgical intervention .  The patient's history has been reviewed, patient examined, no change in status, stable for surgery.  I have reviewed the patient's chart and labs.  Questions were answered to the patient's satisfaction.     Machaela Caterino A.

## 2012-10-05 NOTE — Transfer of Care (Signed)
Immediate Anesthesia Transfer of Care Note  Patient: Eduardo Tucker  Procedure(s) Performed: Procedure(s): RIGHT CARPAL TUNNEL RELEASE ENDOSCOPIC (Right) RIGHT LONG FINGER RELEASE TRIGGER FINGER (Right)  Patient Location: PACU  Anesthesia Type:MAC  Level of Consciousness: awake, alert  and oriented  Airway & Oxygen Therapy: Patient Spontanous Breathing and Patient connected to face mask oxygen  Post-op Assessment: Report given to PACU RN and Post -op Vital signs reviewed and stable  Post vital signs: Reviewed and stable  Complications: No apparent anesthesia complications

## 2012-10-06 ENCOUNTER — Encounter (HOSPITAL_BASED_OUTPATIENT_CLINIC_OR_DEPARTMENT_OTHER): Payer: Self-pay | Admitting: Orthopedic Surgery

## 2013-09-26 IMAGING — CR DG LUMBAR SPINE 2-3V
3 series · 3 of 3 positions shown · non-contrast
Comparison: MR 11/13/2011

CLINICAL DATA: L5-S1 laminectomy

LUMBAR SPINE - 2-3 VIEW

[lateral (1 of 3)]
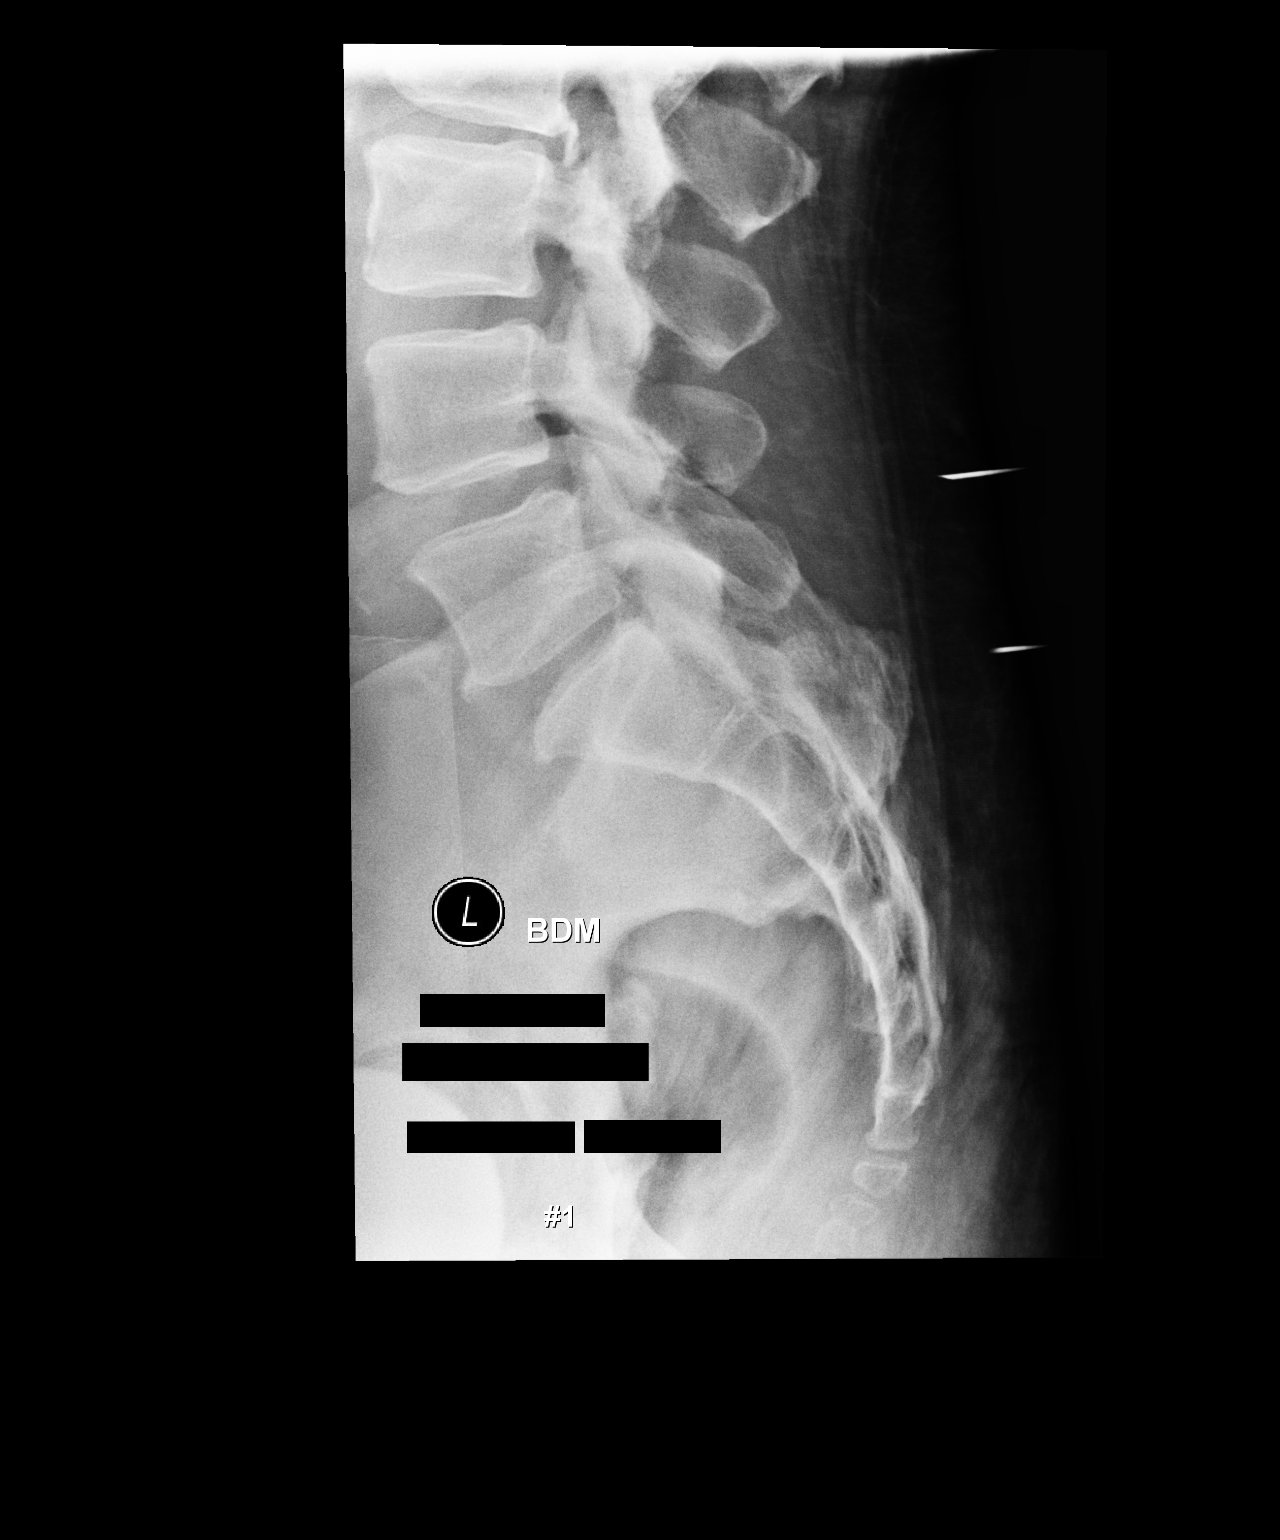

[lateral (2 of 3)]
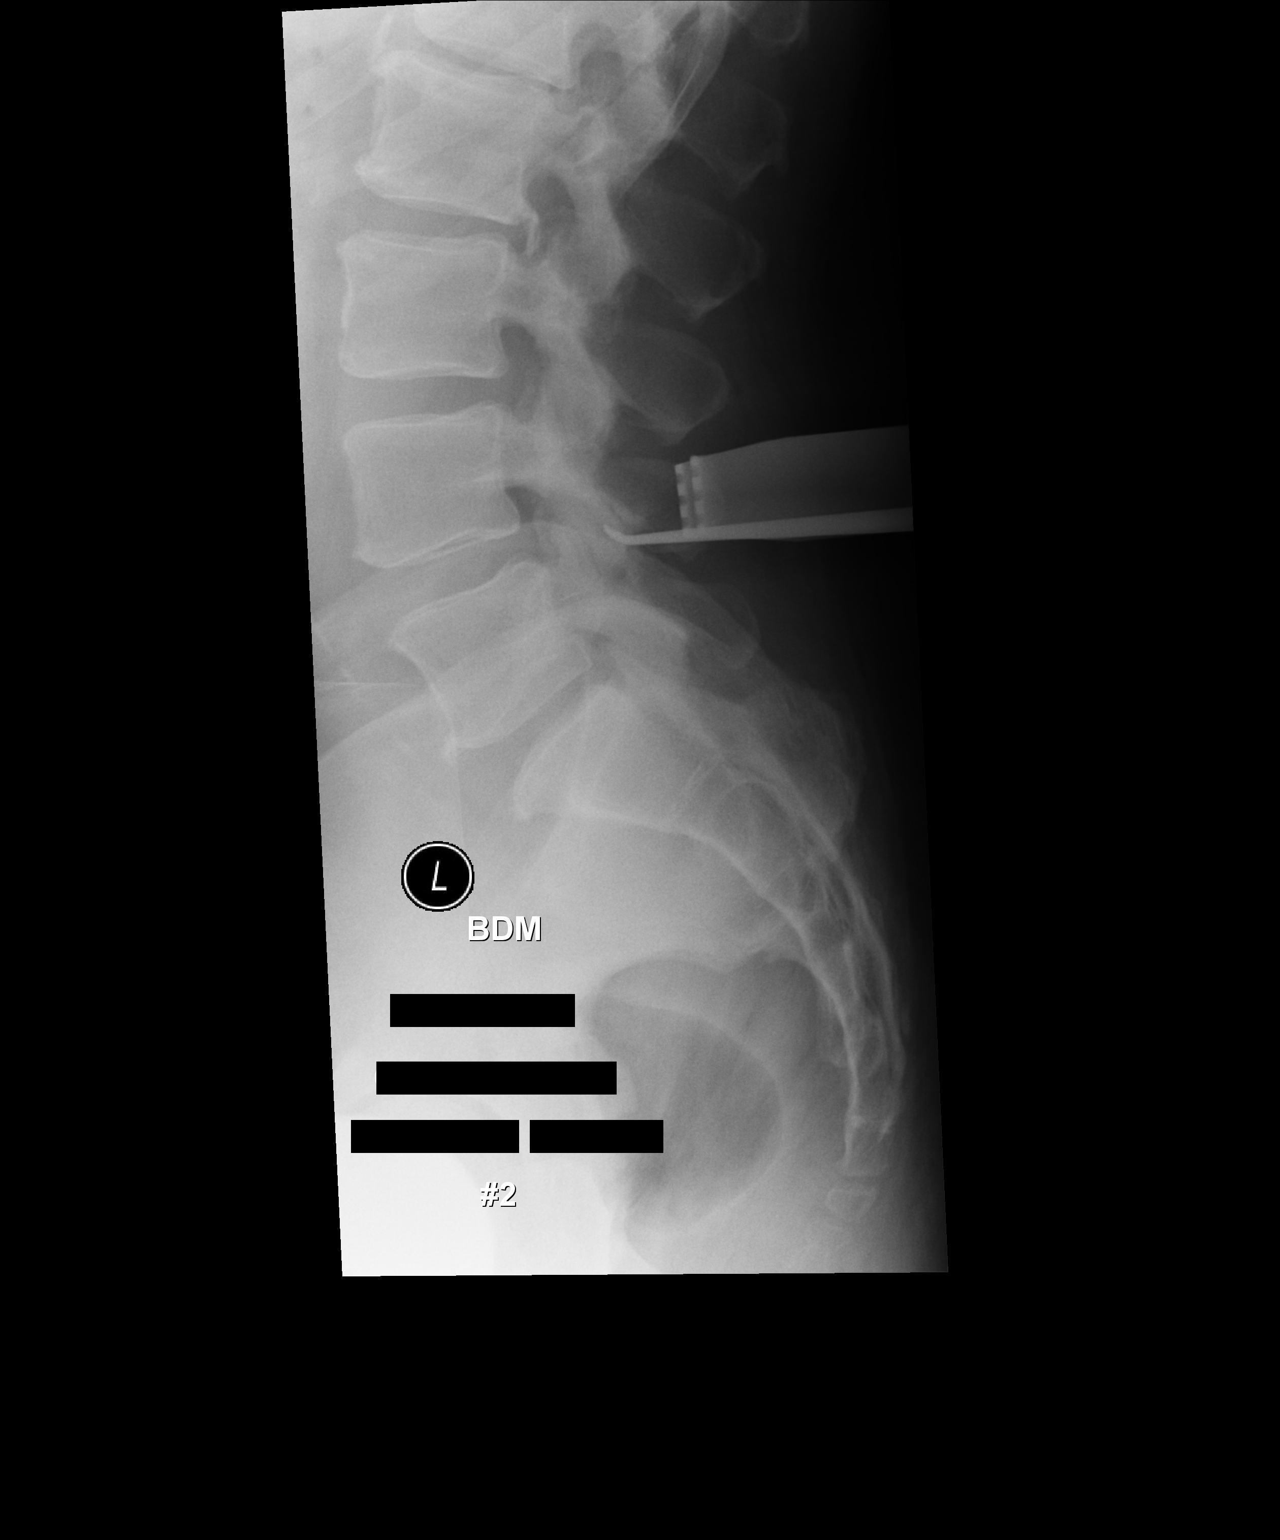

[lateral (3 of 3)]
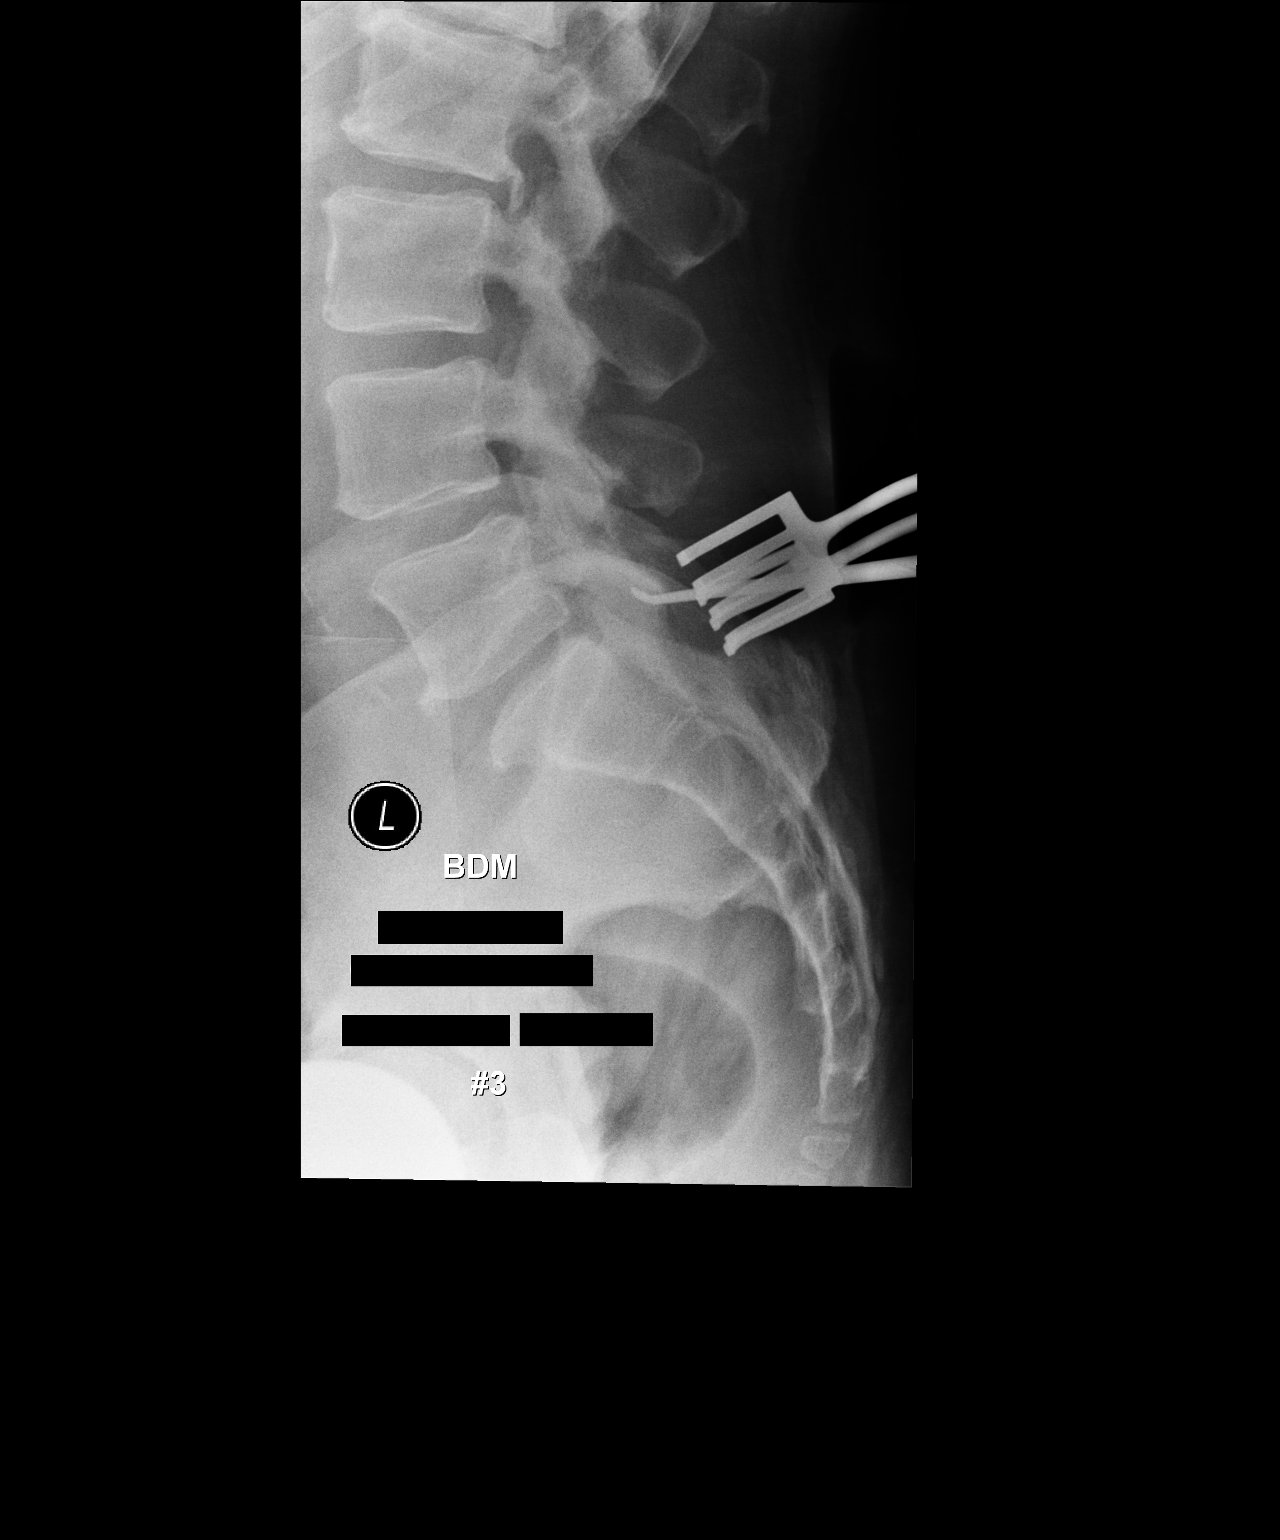

[3 of 3 positions shown; findings below may reference images not displayed]

FINDINGS: The first lateral intraoperative lumbar radiograph shows
needles projecting in the soft tissues posterior to L5 and  the
first sacral segment.  Second film demonstrates a metallic
instrument projecting inferior to the L4 spinous process.  Third
film shows a metallic instrument projecting inferior to the L5
spinous process.
IMPRESSION: Intraoperative localization.

## 2013-09-26 IMAGING — CR DG CHEST 2V
2 series · 2 of 2 positions shown · non-contrast
Comparison: 02/15/2009

CLINICAL DATA: Preop for L5 laminectomy.  Hypertension.  Asthma.
Sleep apnea.  Gastroesophageal reflux disease.

CHEST - 2 VIEW

[view not recorded (1 of 2)]
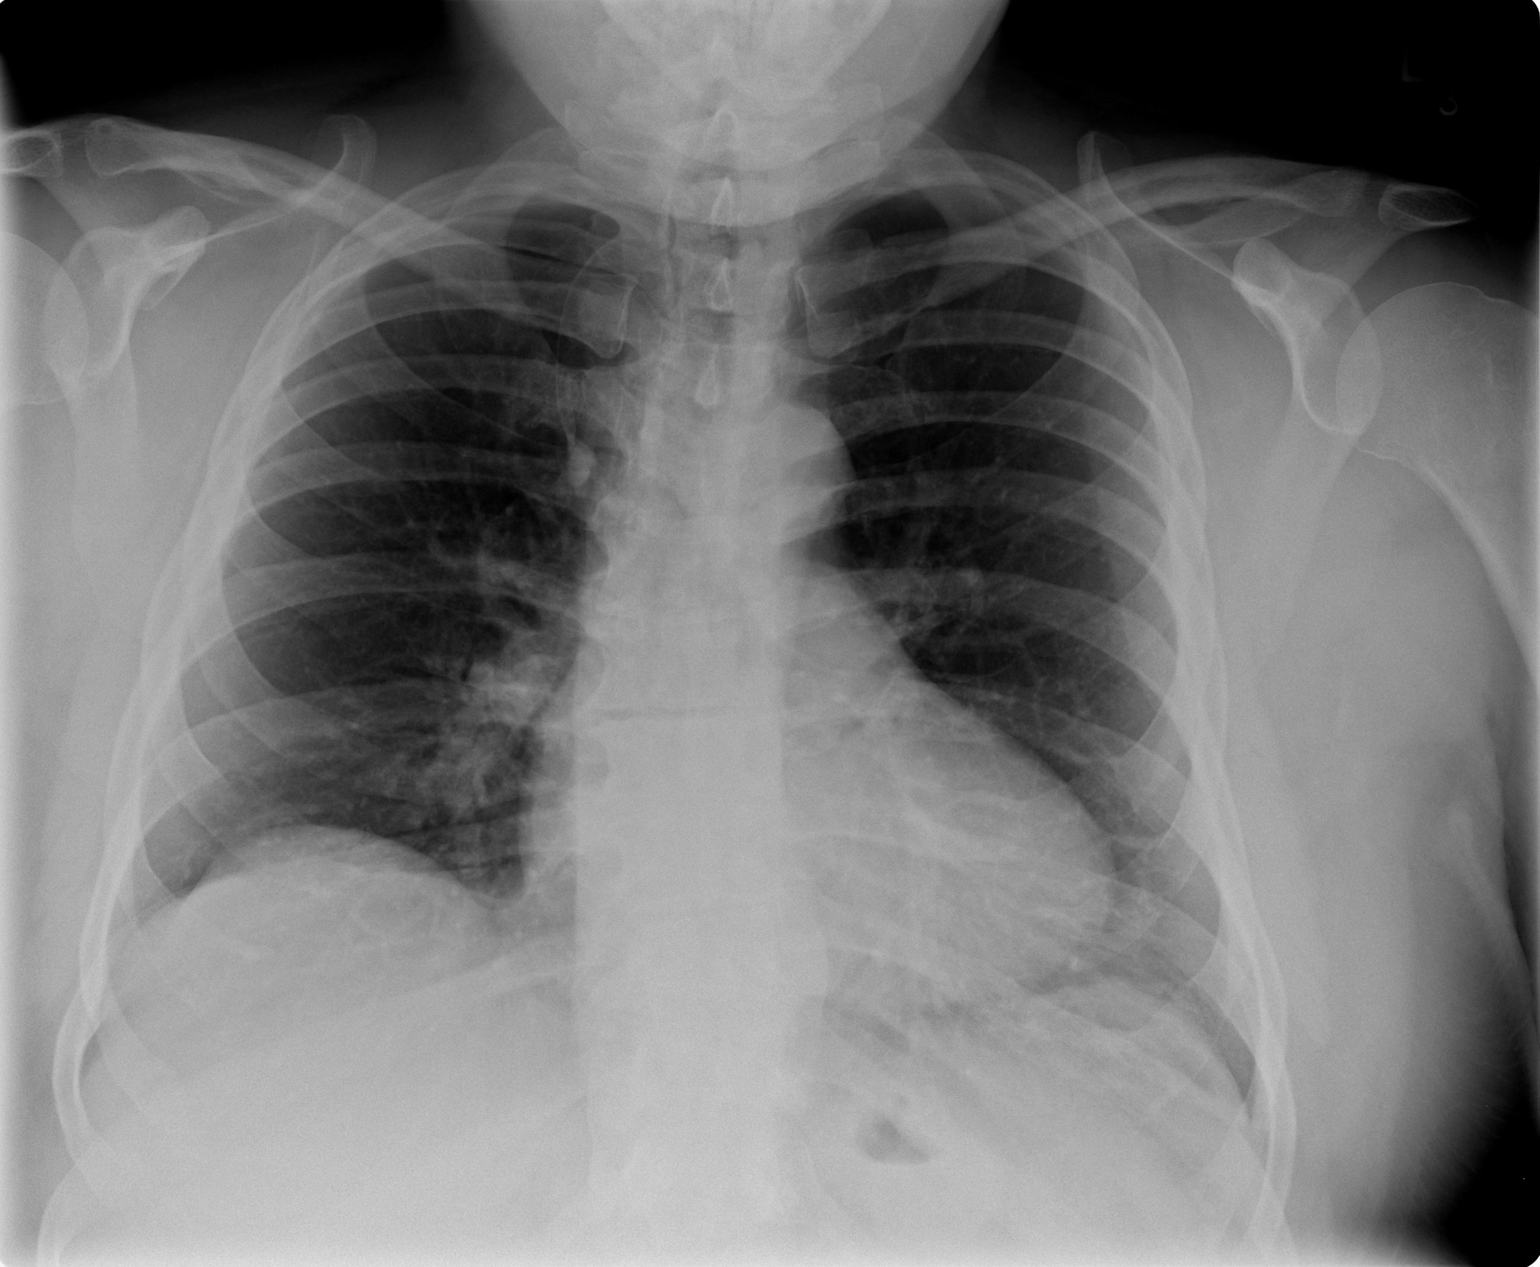

[view not recorded (2 of 2)]
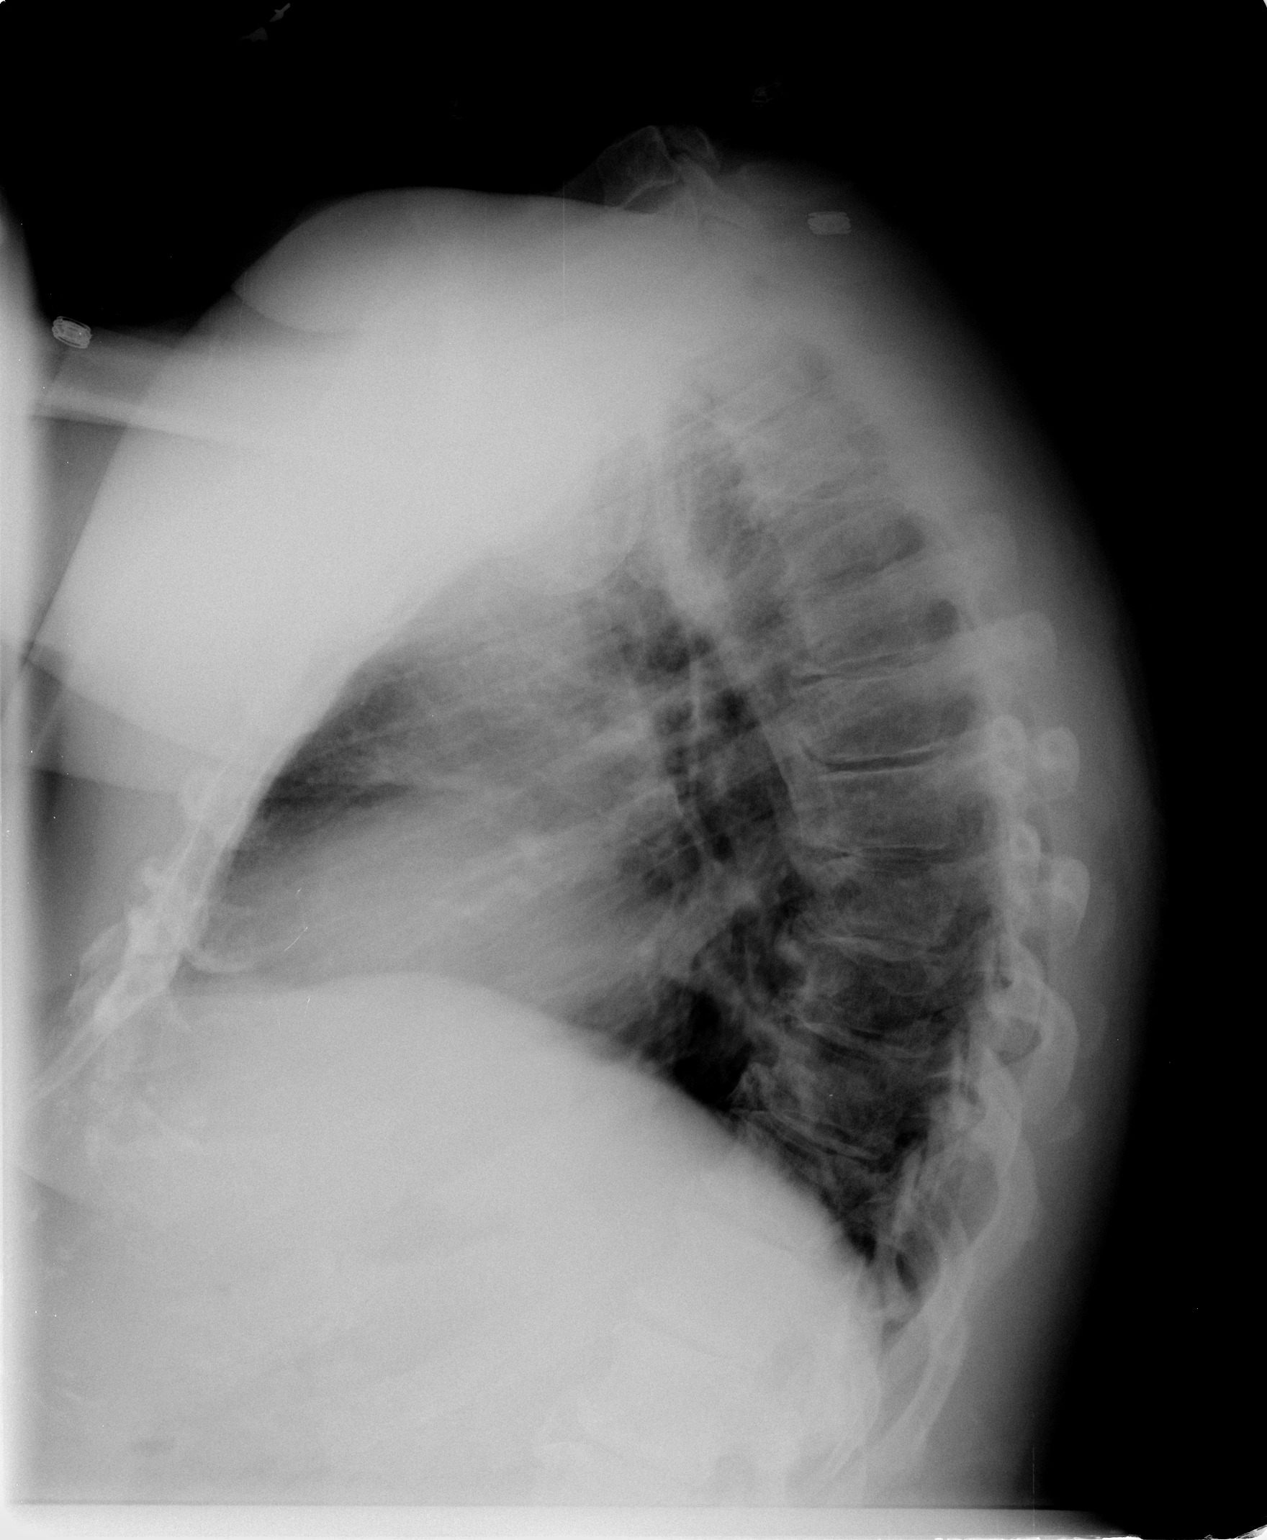

[2 of 2 positions shown; findings below may reference images not displayed]

FINDINGS: Lateral view degraded by patient arm position.

Moderate thoracic spondylosis.

Azygos fissure; normal anatomic variant. Midline trachea.
Borderline cardiomegaly.     Mediastinal contours otherwise within
normal limits.  No pleural effusion or pneumothorax.  Mildly low
lung volumes with volume loss at the bases. No congestive failure.
IMPRESSION: Borderline cardiomegaly and low lung volumes, without acute
disease.

## 2018-01-05 ENCOUNTER — Encounter: Payer: Self-pay | Admitting: Emergency Medicine

## 2018-01-05 ENCOUNTER — Other Ambulatory Visit: Payer: Self-pay

## 2018-01-05 ENCOUNTER — Emergency Department
Admission: EM | Admit: 2018-01-05 | Discharge: 2018-01-05 | Disposition: A | Discharge status: Home / Self Care | Payer: BLUE CROSS/BLUE SHIELD | Source: Home / Self Care | Attending: Family Medicine | Admitting: Family Medicine

## 2018-01-05 DIAGNOSIS — J039 Acute tonsillitis, unspecified: Secondary | ICD-10-CM | POA: Diagnosis not present

## 2018-01-05 LAB — POCT RAPID STREP A (OFFICE): Rapid Strep A Screen: NEGATIVE

## 2018-01-05 MED ORDER — MAGIC MOUTHWASH W/LIDOCAINE: 5.0000 mL | Freq: Three times a day (TID) | ORAL | 0 refills | Status: DC | PRN

## 2018-01-05 MED ORDER — MAGIC MOUTHWASH W/LIDOCAINE: 5.0000 mL | Freq: Three times a day (TID) | ORAL | 0 refills | Status: AC | PRN

## 2018-01-05 NOTE — Discharge Instructions (Signed)
°  You may take 500mg  acetaminophen every 4-6 hours or in combination with ibuprofen 400-600mg  every 6-8 hours as needed for pain, inflammation, and fever.  Be sure to stay well hydrated and get at least 8 hours of sleep at night, preferably more while sick.   Please call your family doctor to follow up next week if not improving.

## 2018-01-05 NOTE — ED Provider Notes (Signed)
[facility]    CSN: [id number] Arrival date & time: 01/05/18  0805     History   Chief Complaint Chief Complaint  Patient presents with  . Sore Throat    HPI Eduardo Tucker is a 52 y.o. male.   HPI Eduardo Tucker is a 52 y.o. male presenting to UC with c/o 2 days worsening sore throat and bilateral ear pain. His wife has also been sick and encouraged him to be evaluated today. He has been able to eat and drinking but pain is moderate in severity when swallowing. Denies fever, chills, n/v/d, cough or congestion.    Past Medical History:  Diagnosis Date  . Arthritis   . Asthma   . Complication of anesthesia    takes so much medicine-he is hard to put under sedation  . Depression   . GERD (gastroesophageal reflux disease)    hx  . Headache(784.0)    hx migraines  . Hypertension   . Pneumonia    hx  . Sleep apnea    cpap used 6 yrs  . Wears glasses     There are no active problems to display for this patient.   Past Surgical History:  Procedure Laterality Date  . CARPAL TUNNEL RELEASE Right 10/05/2012   Procedure: RIGHT CARPAL TUNNEL RELEASE ENDOSCOPIC;  Surgeon: Jodi Marble, MD;  Location: [facility];  Service: Orthopedics;  Laterality: Right;  . JOINT REPLACEMENT  11   hip rt  . LUMBAR LAMINECTOMY/DECOMPRESSION MICRODISCECTOMY  01/01/2012   Procedure: LUMBAR LAMINECTOMY/DECOMPRESSION MICRODISCECTOMY;  Surgeon: Emilee Hero, MD;  Location: [facility] OR;  Service: Orthopedics;  Laterality: Right;  Right sided lumbar 5-sacrum 1 microdisectomy  . MUSCLE BIOPSY     rt tricep mass  . TRIGGER FINGER RELEASE Right 10/05/2012   Procedure: RIGHT LONG FINGER RELEASE TRIGGER FINGER;  Surgeon: Jodi Marble, MD;  Location: [facility];  Service: Orthopedics;  Laterality: Right;       Home Medications    Prior to Admission medications   Medication Sig Start Date End Date Taking? Authorizing Provider  ALPRAZolam Prudy Feeler) 1  MG tablet Take 1 mg by mouth at bedtime as needed.    [provider]  atorvastatin (LIPITOR) 80 MG tablet Take 80 mg by mouth at bedtime.     [provider]  carbamazepine (ANTIPSYCHOTIC - EQUETRO) 200 MG CP12 Take 200 mg by mouth 2 times daily at 12 noon and 4 pm. Take four times a day per patient.    [provider]  divalproex (DEPAKOTE) 500 MG DR tablet Take 2,000 mg by mouth Nightly.     [provider]  fluticasone-salmeterol (ADVAIR HFA) 115-21 MCG/ACT inhaler Inhale 2 puffs into the lungs daily as needed.    [provider]  gabapentin (NEURONTIN) 100 MG capsule Take 100 mg by mouth 3 (three) times daily. Takes 3 tid    [provider]  HYDROcodone-acetaminophen (NORCO/VICODIN) 5-325 MG per tablet Take 1 tablet by mouth every 6 (six) hours as needed.    [provider]  lithium carbonate (ESKALITH) 450 MG CR tablet Take 700 mg by mouth Nightly.     [provider]  losartan-hydrochlorothiazide (HYZAAR) 100-25 MG per tablet Take 1 tablet by mouth every evening.     [provider]  magic mouthwash w/lidocaine SOLN Take 5 mLs by mouth 3 (three) times daily as needed for mouth pain. 01/05/18   Lurene Shadow, PA-C  Multiple  Vitamin (MULTIVITAMIN WITH MINERALS) TABS Take 1 tablet by mouth daily.    [provider]    Family History Family History  Problem Relation Age of Onset  . Diabetes Mother   . Hypertension Father   . Heart attack Father     Social History Social History   Tobacco Use  . Smoking status: Current Some Day Smoker    Years: 2.00    Types: Cigars  . Smokeless tobacco: Never Used  . Tobacco comment: approx 1 month  social rinker occ  Substance Use Topics  . Alcohol use: Yes  . Drug use: No     Allergies   Penicillins   Review of Systems Review of Systems  Constitutional: Negative for chills and fever.  HENT: Positive for ear pain and sore throat. Negative for  congestion, trouble swallowing and voice change.   Respiratory: Negative for cough and shortness of breath.   Cardiovascular: Negative for chest pain and palpitations.  Gastrointestinal: Negative for abdominal pain, diarrhea, nausea and vomiting.  Musculoskeletal: Negative for arthralgias, back pain and myalgias.  Skin: Negative for rash.  Neurological: Negative for dizziness, light-headedness and headaches.     Physical Exam Triage Vital Signs ED Triage Vitals  Enc Vitals Group     BP 01/05/18 0834 (!) 142/84     Pulse Rate 01/05/18 0834 73     Resp --      Temp 01/05/18 0834 97.7 F (36.5 C)     Temp Source 01/05/18 0834 Oral     SpO2 01/05/18 0834 97 %     Weight 01/05/18 0835 255 lb (115.7 kg)     Height 01/05/18 0835 5\' 5"  (1.651 m)     Head Circumference --      Peak Flow --      Pain Score 01/05/18 0835 7     Pain Loc --      Pain Edu? --      Excl. in GC? --    No data found.  Updated Vital Signs BP (!) 142/84 (BP Location: Right Arm)   Pulse 73   Temp 97.7 F (36.5 C) (Oral)   Ht 5\' 5"  (1.651 m)   Wt 255 lb (115.7 kg)   SpO2 97%   BMI 42.43 kg/m   Visual Acuity Right Eye Distance:   Left Eye Distance:   Bilateral Distance:    Right Eye Near:   Left Eye Near:    Bilateral Near:     Physical Exam  Constitutional: He is oriented to person, place, and time. He appears well-developed and well-nourished.  HENT:  Head: Normocephalic and atraumatic.  Right Ear: Tympanic membrane normal.  Left Ear: Tympanic membrane normal.  Nose: Nose normal. Right sinus exhibits no maxillary sinus tenderness and no frontal sinus tenderness. Left sinus exhibits no maxillary sinus tenderness and no frontal sinus tenderness.  Mouth/Throat: Uvula is midline and mucous membranes are normal. No trismus in the jaw. Oropharyngeal exudate, posterior oropharyngeal edema and posterior oropharyngeal erythema present. No tonsillar abscesses.  Exudate mainly on uvula  Eyes: EOM are  normal.  Neck: Normal range of motion. Neck supple.  Cardiovascular: Normal rate and regular rhythm.  Pulmonary/Chest: Effort normal and breath sounds normal.  Musculoskeletal: Normal range of motion.  Lymphadenopathy:    He has no cervical adenopathy.  Neurological: He is alert and oriented to person, place, and time.  Skin: Skin is warm and dry.  Psychiatric: He has a normal mood and affect. His behavior is  normal.  Nursing note and vitals reviewed.    UC Treatments / Results  Labs (all labs ordered are listed, but only abnormal results are displayed) Labs Reviewed  STREP A DNA PROBE  POCT RAPID STREP A (OFFICE)    EKG None  Radiology No results found.  Procedures Procedures (including critical care time)  Medications Ordered in UC Medications - No data to display  Initial Impression / Assessment and Plan / UC Course  I have reviewed the triage vital signs and the nursing notes.  Pertinent labs & imaging results that were available during my care of the patient were reviewed by me and considered in my medical decision making (see chart for details).     Rapid strep: NEGATIVE  Hx and exam c/w viral illness Encouraged symptomatic treatment at this time.   Final Clinical Impressions(s) / UC Diagnoses   Final diagnoses:  Exudative tonsillitis     Discharge Instructions      You may take 500mg  acetaminophen every 4-6 hours or in combination with ibuprofen 400-600mg  every 6-8 hours as needed for pain, inflammation, and fever.  Be sure to stay well hydrated and get at least 8 hours of sleep at night, preferably more while sick.   Please call your family doctor to follow up next week if not improving.      ED Prescriptions    Medication Sig Dispense Auth. Provider   magic mouthwash w/lidocaine SOLN  (Status: Discontinued) Take 5 mLs by mouth 3 (three) times daily as needed for mouth pain. 60 mL Waylan RocherPhelps, Tilly Pernice O, PA-C   magic mouthwash w/lidocaine SOLN Take 5  mLs by mouth 3 (three) times daily as needed for mouth pain. 60 mL Lurene ShadowPhelps, Katrina Brosh O, PA-C     Controlled Substance Prescriptions [place] Controlled Substance Registry consulted? Not Applicable   Rolla Platehelps, Jaremy Nosal O, PA-C 01/05/18 1209

## 2018-01-05 NOTE — ED Triage Notes (Signed)
Sore throat, bilateral ear pain x 5 days

## 2018-01-06 ENCOUNTER — Telehealth: Payer: Self-pay

## 2018-01-06 LAB — STREP A DNA PROBE: Group A Strep Probe: NOT DETECTED

## 2018-01-06 NOTE — Telephone Encounter (Signed)
Left message with lab results and follow up information for any questions. 

## 2019-11-08 ENCOUNTER — Other Ambulatory Visit: Payer: Self-pay | Admitting: Physical Medicine and Rehabilitation

## 2019-11-08 DIAGNOSIS — M47816 Spondylosis without myelopathy or radiculopathy, lumbar region: Secondary | ICD-10-CM

## 2019-11-22 ENCOUNTER — Other Ambulatory Visit: Payer: BLUE CROSS/BLUE SHIELD

## 2019-11-25 ENCOUNTER — Ambulatory Visit
Admission: RE | Admit: 2019-11-25 | Discharge: 2019-11-25 | Disposition: A | Discharge status: Home / Self Care | Payer: BC Managed Care – PPO | Source: Ambulatory Visit | Attending: Physical Medicine and Rehabilitation | Admitting: Physical Medicine and Rehabilitation

## 2019-11-25 ENCOUNTER — Inpatient Hospital Stay: Admission: RE | Admit: 2019-11-25 | Payer: BLUE CROSS/BLUE SHIELD | Source: Ambulatory Visit

## 2019-11-25 ENCOUNTER — Other Ambulatory Visit: Payer: Self-pay

## 2019-11-25 DIAGNOSIS — M47816 Spondylosis without myelopathy or radiculopathy, lumbar region: Secondary | ICD-10-CM

## 2020-07-25 ENCOUNTER — Other Ambulatory Visit: Payer: Self-pay

## 2020-07-25 ENCOUNTER — Telehealth: Payer: Self-pay | Admitting: Emergency Medicine

## 2020-07-25 ENCOUNTER — Encounter: Payer: Self-pay | Admitting: Emergency Medicine

## 2020-07-25 ENCOUNTER — Emergency Department (INDEPENDENT_AMBULATORY_CARE_PROVIDER_SITE_OTHER)
Admission: EM | Admit: 2020-07-25 | Discharge: 2020-07-25 | Disposition: A | Discharge status: Home / Self Care | Payer: BC Managed Care – PPO | Source: Home / Self Care

## 2020-07-25 ENCOUNTER — Emergency Department (INDEPENDENT_AMBULATORY_CARE_PROVIDER_SITE_OTHER): Payer: BC Managed Care – PPO

## 2020-07-25 DIAGNOSIS — R0609 Other forms of dyspnea: Secondary | ICD-10-CM

## 2020-07-25 DIAGNOSIS — R058 Other specified cough: Secondary | ICD-10-CM | POA: Diagnosis not present

## 2020-07-25 DIAGNOSIS — R059 Cough, unspecified: Secondary | ICD-10-CM

## 2020-07-25 DIAGNOSIS — R0602 Shortness of breath: Secondary | ICD-10-CM

## 2020-07-25 DIAGNOSIS — R0600 Dyspnea, unspecified: Secondary | ICD-10-CM

## 2020-07-25 DIAGNOSIS — U071 COVID-19: Secondary | ICD-10-CM | POA: Diagnosis not present

## 2020-07-25 DIAGNOSIS — R61 Generalized hyperhidrosis: Secondary | ICD-10-CM | POA: Diagnosis not present

## 2020-07-25 LAB — POC SARS CORONAVIRUS 2 AG -  ED: SARS Coronavirus 2 Ag: POSITIVE — AB

## 2020-07-25 MED ORDER — ALBUTEROL SULFATE HFA 108 (90 BASE) MCG/ACT IN AERS: 2.0000 | INHALATION_SPRAY | RESPIRATORY_TRACT | 0 refills | Status: AC | PRN

## 2020-07-25 MED ORDER — MOLNUPIRAVIR EUA 200MG CAPSULE: 4.0000 | ORAL_CAPSULE | Freq: Two times a day (BID) | ORAL | 0 refills | Status: DC

## 2020-07-25 MED ORDER — MOLNUPIRAVIR EUA 200MG CAPSULE: 4.0000 | ORAL_CAPSULE | Freq: Two times a day (BID) | ORAL | 0 refills | Status: AC

## 2020-07-25 MED ORDER — ALBUTEROL SULFATE HFA 108 (90 BASE) MCG/ACT IN AERS: 2.0000 | INHALATION_SPRAY | RESPIRATORY_TRACT | 0 refills | Status: DC | PRN

## 2020-07-25 MED ORDER — BENZONATATE 200 MG PO CAPS: 200.0000 mg | ORAL_CAPSULE | Freq: Three times a day (TID) | ORAL | 0 refills | Status: AC | PRN

## 2020-07-25 NOTE — ED Triage Notes (Signed)
Patient c/o chest congestion, afebrile, productive cough x 4 days, nasal congestion.  Patient has taken Mucinex, Tussin, Ibuprofen 600mg  negative at home COVID test.  Patient is vaccinated.

## 2020-07-25 NOTE — Telephone Encounter (Signed)
Patient came back into clinic and wanted to change his pharmacy from [facility] to [facility] on [address] in [place].  Pharmacy contacted and prescription was cancelled at [facility] and [facility] per patient request.

## 2020-07-25 NOTE — Discharge Instructions (Addendum)
Your EKG shows you have wide QT interval, please see your family doctor for this, since there are medication precautions when this is present. I did not find any information in regards to the antiviral medication I sent to the pharmacy if you wish to start taking it. It will help decrease the amount of virus that reproduces in your body.  The rapid covid test we ran today is positive. I sent a prescription for FDA approved antiviral  investigational medication to help you recover from this.  Follow up with your family Dr next week.  If you get short of breath and you are able to check  your oxygen with a pulse oxygen meter, if it gets to 92% or less, you need to go to the hospital to be admitted. If you dont have one, come back here and we will assess you. Do not stay laying around, but stay sitting when resting and try to walk to prevent from getting pneumonia

## 2020-07-25 NOTE — ED Provider Notes (Signed)
[facility]    CSN: [id number] Arrival date & time: 07/25/20  1248      History   Chief Complaint Chief Complaint  Patient presents with   Chest Congestion    HPI Eduardo Tucker is a 55 y.o. male who presents with chest congestion and productive cough x 4 days, nose congestion. Has not had a fever. Has been taking Mucinex, Tussion, and Ibuprofen. Had neg home Covid test on the first day of his illness. Has had covid injections.  Has been feeling SOB with short distance walking and has had mild wheezing. Has not had an inhaler in years.  Today while just standing in an [facility] unit was sweating.  Has not been able to wear his Bi-pap due to stuffy nose Has been mildly fatigued, but his appetite has not changed.     Past Medical History:  Diagnosis Date   Arthritis    Asthma    Complication of anesthesia    takes so much medicine-he is hard to put under sedation   Depression    GERD (gastroesophageal reflux disease)    hx   Headache(784.0)    hx migraines   Hypertension    Pneumonia    hx   Sleep apnea    cpap used 6 yrs   Wears glasses     There are no problems to display for this patient.   Past Surgical History:  Procedure Laterality Date   CARPAL TUNNEL RELEASE Right 10/05/2012   Procedure: RIGHT CARPAL TUNNEL RELEASE ENDOSCOPIC;  Surgeon: Jodi Marble, MD;  Location: [facility];  Service: Orthopedics;  Laterality: Right;   JOINT REPLACEMENT  11   hip rt   LUMBAR LAMINECTOMY/DECOMPRESSION MICRODISCECTOMY  01/01/2012   Procedure: LUMBAR LAMINECTOMY/DECOMPRESSION MICRODISCECTOMY;  Surgeon: Emilee Hero, MD;  Location: [facility] OR;  Service: Orthopedics;  Laterality: Right;  Right sided lumbar 5-sacrum 1 microdisectomy   MUSCLE BIOPSY     rt tricep mass   TRIGGER FINGER RELEASE Right 10/05/2012   Procedure: RIGHT LONG FINGER RELEASE TRIGGER FINGER;  Surgeon: Jodi Marble, MD;  Location: [facility];  Service:  Orthopedics;  Laterality: Right;       Home Medications    Prior to Admission medications   Medication Sig Start Date End Date Taking? Authorizing Provider  ALPRAZolam Prudy Feeler) 1 MG tablet Take 1 mg by mouth at bedtime as needed.   Yes [provider]  atorvastatin (LIPITOR) 80 MG tablet Take 80 mg by mouth at bedtime.    Yes [provider]  benzonatate (TESSALON) 200 MG capsule Take 1 capsule (200 mg total) by mouth 3 (three) times daily as needed for cough. 07/25/20  Yes Rodriguez-Southworth, Nettie Elm, PA-C  divalproex (DEPAKOTE) 500 MG DR tablet Take 2,000 mg by mouth Nightly.    Yes [provider]  gabapentin (NEURONTIN) 100 MG capsule Take 100 mg by mouth 3 (three) times daily. Takes 3 tid   Yes [provider]  lithium carbonate (ESKALITH) 450 MG CR tablet Take 700 mg by mouth Nightly.    Yes [provider]  lithium carbonate (ESKALITH) 450 MG CR tablet Take 2 tablets by mouth at bedtime. 05/05/15  Yes [provider]  losartan-hydrochlorothiazide (HYZAAR) 100-25 MG per tablet Take 1 tablet by mouth every evening.    Yes [provider]  Multiple Vitamin (MULTIVITAMIN WITH MINERALS) TABS Take 1 tablet by mouth daily.   Yes [provider]  albuterol (VENTOLIN HFA) 108 (  90 Base) MCG/ACT inhaler Inhale 2 puffs into the lungs every 4 (four) hours as needed for wheezing or shortness of breath. 07/25/20   Rodriguez-Southworth, Nettie ElmSylvia, PA-C  carbamazepine (ANTIPSYCHOTIC - EQUETRO) 200 MG CP12 Take 200 mg by mouth 2 times daily at 12 noon and 4 pm. Take four times a day per patient.    [provider]  fluticasone-salmeterol (ADVAIR HFA) 115-21 MCG/ACT inhaler Inhale 2 puffs into the lungs daily as needed.    [provider]  HYDROcodone-acetaminophen (NORCO/VICODIN) 5-325 MG per tablet Take 1 tablet by mouth every 6 (six) hours as needed.    [provider]  magic mouthwash w/lidocaine SOLN Take 5  mLs by mouth 3 (three) times daily as needed for mouth pain. 01/05/18   Lurene ShadowPhelps, Erin O, PA-C  molnupiravir EUA 200 mg CAPS Take 4 capsules (800 mg total) by mouth 2 (two) times daily for 5 days. 07/25/20 07/30/20  Rodriguez-Southworth, Nettie ElmSylvia, PA-C    Family History Family History  Problem Relation Age of Onset   Diabetes Mother    Hypertension Father    Heart attack Father     Social History Social History   Tobacco Use   Smoking status: Some Days    Pack years: 0.00    Types: Cigars   Smokeless tobacco: Never   Tobacco comments:    approx 1 month  social r[employer]  Vaping Use   Vaping Use: Never used  Substance Use Topics   Alcohol use: Yes   Drug use: No     Allergies   Penicillins   Review of Systems Review of Systems  Constitutional:  Positive for diaphoresis. Negative for activity change, appetite change, chills and fever.  HENT:  Positive for congestion and postnasal drip. Negative for ear discharge, ear pain and sore throat.   Eyes:  Negative for discharge.  Respiratory:  Positive for cough, chest tightness, shortness of breath and wheezing.   Cardiovascular:  Negative for chest pain.  Musculoskeletal:  Positive for neck pain. Negative for arthralgias and myalgias.  Neurological:  Negative for headaches.  Hematological:  Negative for adenopathy.    Physical Exam Triage Vital Signs ED Triage Vitals  Enc Vitals Group     BP 07/25/20 1306 (!) 150/82     Pulse Rate 07/25/20 1306 85     Resp --      Temp 07/25/20 1306 98.5 F (36.9 C)     Temp Source 07/25/20 1306 Oral     SpO2 07/25/20 1306 97 %     Weight --      Height --      Head Circumference --      Peak Flow --      Pain Score 07/25/20 1307 0     Pain Loc --      Pain Edu? --      Excl. in GC? --    No data found.  Updated Vital Signs BP (!) 150/82 (BP Location: Right Arm)   Pulse 85   Temp 98.5 F (36.9 C) (Oral)   SpO2 97%   Visual Acuity Right Eye Distance:   Left Eye  Distance:   Bilateral Distance:    Right Eye Near:   Left Eye Near:    Bilateral Near:     Physical Exam Physical Exam Vitals signs and nursing note reviewed.  Constitutional:      General: She is not in acute distress.    Appearance: Normal appearance. he is not ill-appearing, toxic-appearing  or diaphoretic.  HENT:     Head: Normocephalic.     Right Ear: Tympanic membrane, ear canal and external ear normal.     Left Ear: Tympanic membrane, ear canal and external ear normal.     Nose: with moderate congestion    Mouth/Throat:     Mouth: Mucous membranes are moist.  Eyes:     General: No scleral icterus.       Right eye: No discharge.        Left eye: No discharge.     Conjunctiva/sclera: Conjunctivae normal.  Neck:     Musculoskeletal: Neck supple. No neck rigidity.  Cardiovascular:     Rate and Rhythm: Normal rate and regular rhythm.     Heart sounds: No murmur.  Pulmonary:     Effort: Pulmonary effort is normal.     Breath sounds: Normal breath sounds.   Musculoskeletal: Normal range of motion.  Lymphadenopathy:     Cervical: No cervical adenopathy.  Skin:    General: Skin is warm and dry.     Coloration: Skin is not jaundiced.     Findings: No rash.  Neurological:     Mental Status: he is alert and oriented to person, place, and time.     Gait: Gait normal.  Psychiatric:        Mood and Affect: Mood normal.        Behavior: Behavior normal.        Thought Content: Thought content normal.        Judgment: Judgment normal.    UC Treatments / Results  Labs (all labs ordered are listed, but only abnormal results are displayed) Labs Reviewed  POC SARS CORONAVIRUS 2 AG -  ED - Abnormal; Notable for the following components:      Result Value   SARS Coronavirus 2 Ag Positive (*)    All other components within normal limits    EKG Sinus rhythm with wide QT which is new when compared with EKG from 2013  Radiology DG Chest 2 View  Result Date:  07/25/2020 CLINICAL DATA:  Productive cough and shortness of breath for about 4 days. EXAM: CHEST - 2 VIEW COMPARISON:  PA and lateral chest 01/01/2012. FINDINGS: Lungs clear. Heart size normal. No pneumothorax or pleural fluid. No acute or focal bony abnormality. IMPRESSION: Negative chest. Electronically Signed   By: Drusilla Kanner M.D.   On: 07/25/2020 13:48    Procedures Procedures (including critical care time)  Medications Ordered in UC Medications - No data to display  Initial Impression / Assessment and Plan / UC Course  I have reviewed the triage vital signs and the nursing notes. Pertinent labs & imaging results that were available during my care of the patient were reviewed by me and considered in my medical decision making (see chart for details). I sent prescription of Molnupiravir, Albuterol inhaler and tessalon perless I looked for adverse reactions of Molnupiravir and there was no information on wide QT interval.  See instructions.    Final Clinical Impressions(s) / UC Diagnoses   Final diagnoses:  Cough  COVID-19 virus infection     Discharge Instructions      Your EKG shows you have wide QT interval, please see your family doctor for this, since there are medication precautions when this is present. I did not find any information in regards to the antiviral medication I sent to the pharmacy if you wish to start taking it. It will help decrease the amount  of virus that reproduces in your body.  The rapid covid test we ran today is positive. I sent a prescription for FDA approved antiviral  investigational medication to help you recover from this.  Follow up with your family Dr next week.  If you get short of breath and you are able to check  your oxygen with a pulse oxygen meter, if it gets to 92% or less, you need to go to the hospital to be admitted. If you dont have one, come back here and we will assess you. Do not stay laying around, but stay sitting when  resting and try to walk to prevent from getting pneumonia       ED Prescriptions     Medication Sig Dispense Auth. Provider   molnupiravir EUA 200 mg CAPS  (Status: Discontinued) Take 4 capsules (800 mg total) by mouth 2 (two) times daily for 5 days. 40 capsule Rodriguez-Southworth, Nettie Elm, PA-C   albuterol (VENTOLIN HFA) 108 (90 Base) MCG/ACT inhaler  (Status: Discontinued) Inhale 2 puffs into the lungs every 4 (four) hours as needed for wheezing or shortness of breath. 1 each Rodriguez-Southworth, Nettie Elm, PA-C   albuterol (VENTOLIN HFA) 108 (90 Base) MCG/ACT inhaler Inhale 2 puffs into the lungs every 4 (four) hours as needed for wheezing or shortness of breath. 1 each Rodriguez-Southworth, Nettie Elm, PA-C   molnupiravir EUA 200 mg CAPS Take 4 capsules (800 mg total) by mouth 2 (two) times daily for 5 days. 40 capsule Rodriguez-Southworth, Wandalee Klang, PA-C   benzonatate (TESSALON) 200 MG capsule Take 1 capsule (200 mg total) by mouth 3 (three) times daily as needed for cough. 30 capsule Rodriguez-Southworth, Nettie Elm, PA-C      PDMP not reviewed this encounter.   Garey Ham, PA-C 07/25/20 1449
